# Patient Record
Sex: Male | Born: 1975
Health system: Southern US, Community
[De-identification: ages and names within clinical notes are randomized; demographics above are authoritative.]

## PROBLEM LIST (undated history)

## (undated) DIAGNOSIS — K219 Gastro-esophageal reflux disease without esophagitis: Secondary | ICD-10-CM

## (undated) DIAGNOSIS — M1990 Unspecified osteoarthritis, unspecified site: Secondary | ICD-10-CM

## (undated) DIAGNOSIS — T7840XA Allergy, unspecified, initial encounter: Secondary | ICD-10-CM

## (undated) HISTORY — DX: Unspecified osteoarthritis, unspecified site: M19.90

## (undated) HISTORY — DX: Allergy, unspecified, initial encounter: T78.40XA

## (undated) HISTORY — PX: HIP SURGERY: SHX245

---

## 2004-10-28 ENCOUNTER — Ambulatory Visit: Payer: Self-pay | Admitting: Internal Medicine

## 2004-10-31 ENCOUNTER — Ambulatory Visit: Payer: Self-pay | Admitting: Internal Medicine

## 2004-11-04 ENCOUNTER — Ambulatory Visit: Payer: Self-pay | Admitting: Internal Medicine

## 2004-12-26 ENCOUNTER — Ambulatory Visit: Payer: Self-pay | Admitting: Internal Medicine

## 2007-07-11 ENCOUNTER — Emergency Department (HOSPITAL_COMMUNITY): Admission: EM | Admit: 2007-07-11 | Discharge: 2007-07-11 | Payer: Self-pay | Admitting: Family Medicine

## 2007-07-12 ENCOUNTER — Ambulatory Visit: Payer: Self-pay | Admitting: Internal Medicine

## 2007-07-12 DIAGNOSIS — J019 Acute sinusitis, unspecified: Secondary | ICD-10-CM

## 2007-07-12 DIAGNOSIS — M25559 Pain in unspecified hip: Secondary | ICD-10-CM

## 2008-10-08 ENCOUNTER — Telehealth: Payer: Self-pay | Admitting: Internal Medicine

## 2009-05-20 ENCOUNTER — Emergency Department (HOSPITAL_COMMUNITY): Admission: EM | Admit: 2009-05-20 | Discharge: 2009-05-20 | Payer: Self-pay | Admitting: Family Medicine

## 2009-07-09 IMAGING — CT CT PARANASAL SINUSES LIMITED
3 series · 14 of 47 positions shown, 16 images · non-contrast
Comparison: None available

CLINICAL DATA: HEADACHE, FEVER

CT PARANASAL SINUS LIMITED WITHOUT CONTRAST
TECHNIQUE: Multidetector CT imaging through the paranasal sinuses
was performed in a single plane.

[Series 4: sinus 2.0 h30s st · axial · 0.42mm/px · z∈[-204,-106]mm · 8 of 57 slices shown, 10 images]
[im 4/57  brain]
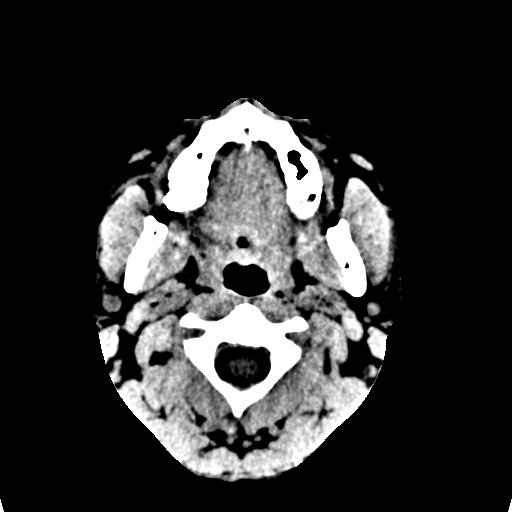
[im 4/57  bone]
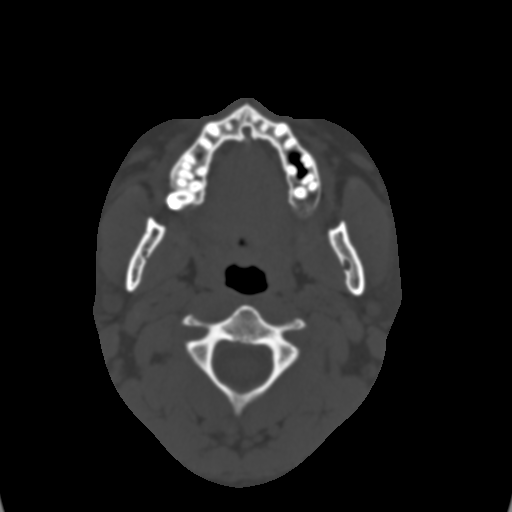
[im 12/57  bone]
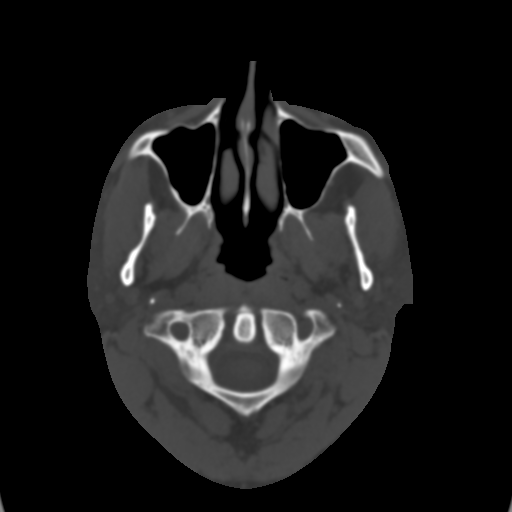
[im 18/57  bone]
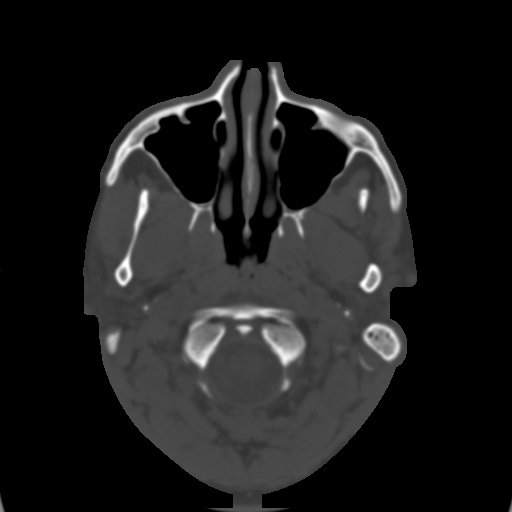
[im 26/57  bone]
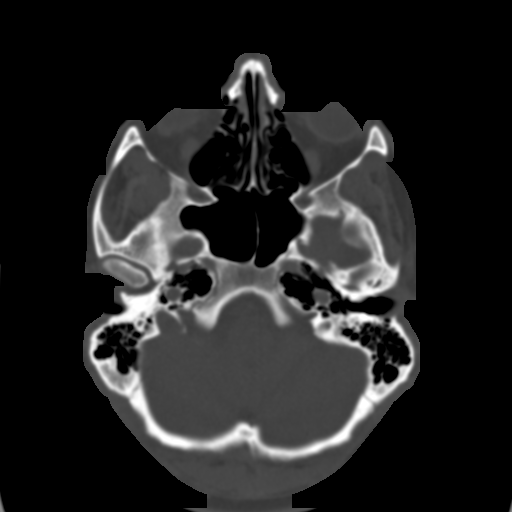
[im 31/57  brain]
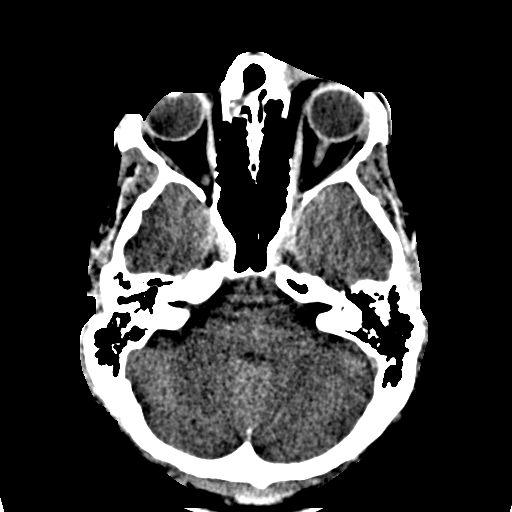
[im 31/57  bone]
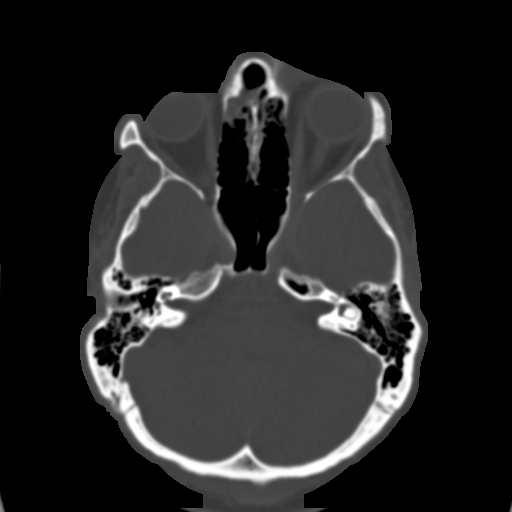
[im 39/57  bone]
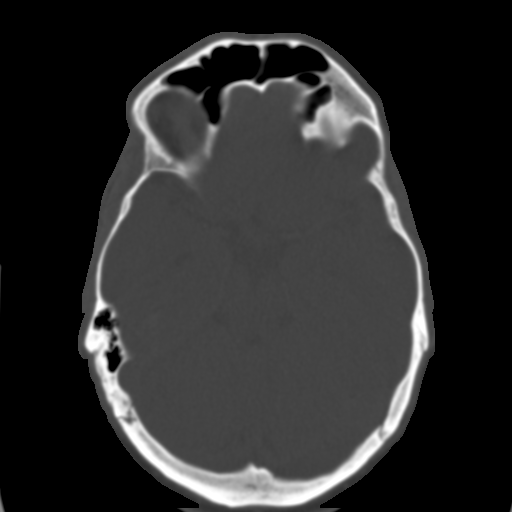
[im 45/57  bone]
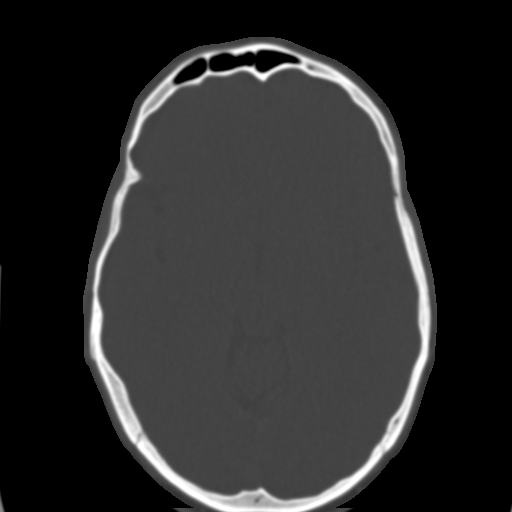
[im 53/57  bone]
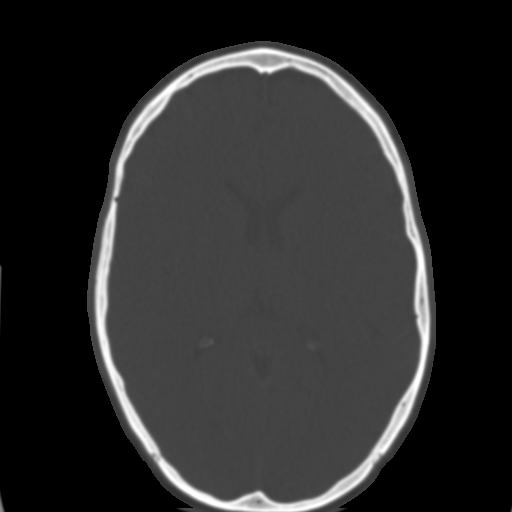

[Series 7: sinus 2.0 spo cor cor cor cor · coronal · 0.26mm/px · 3 of 103 slices shown]
[im 35/103  bone]
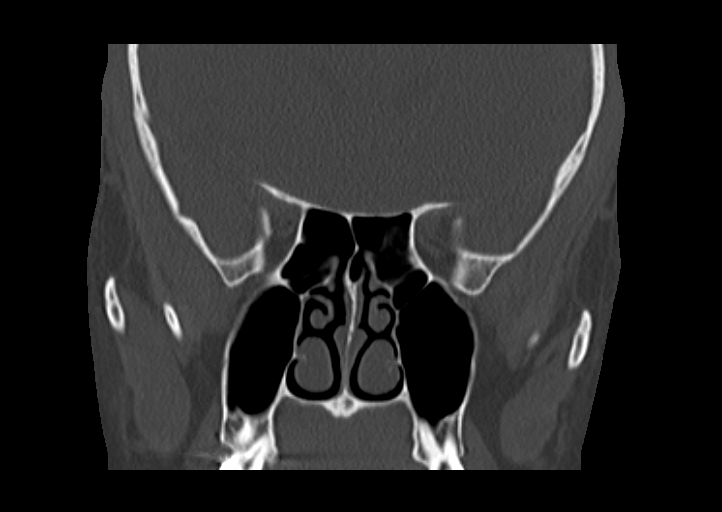
[im 46/103  bone]
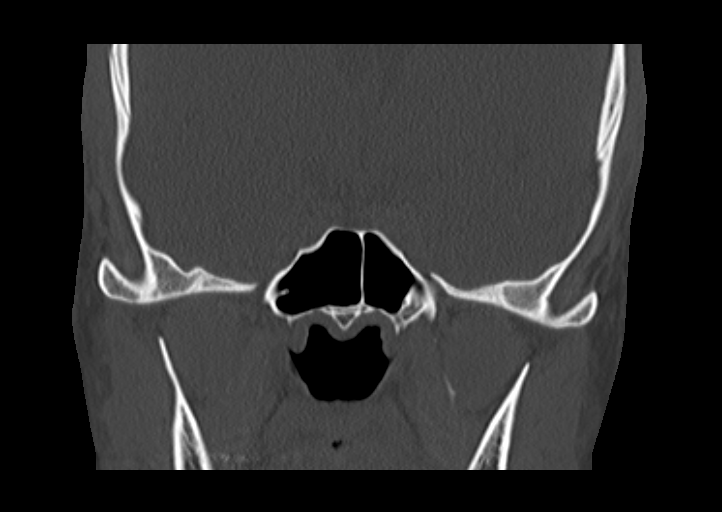
[im 57/103  bone]
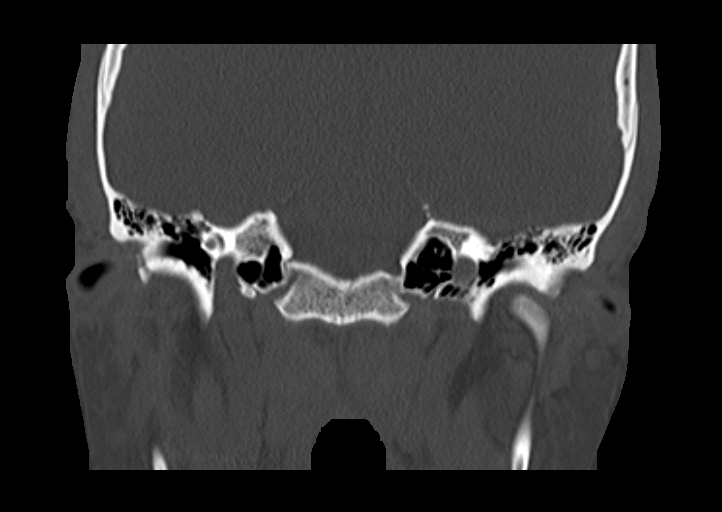

[Series 8: sinus 2.0 spo · sagittal · 0.23mm/px · 3 of 89 slices shown]
[im 30/89  bone]
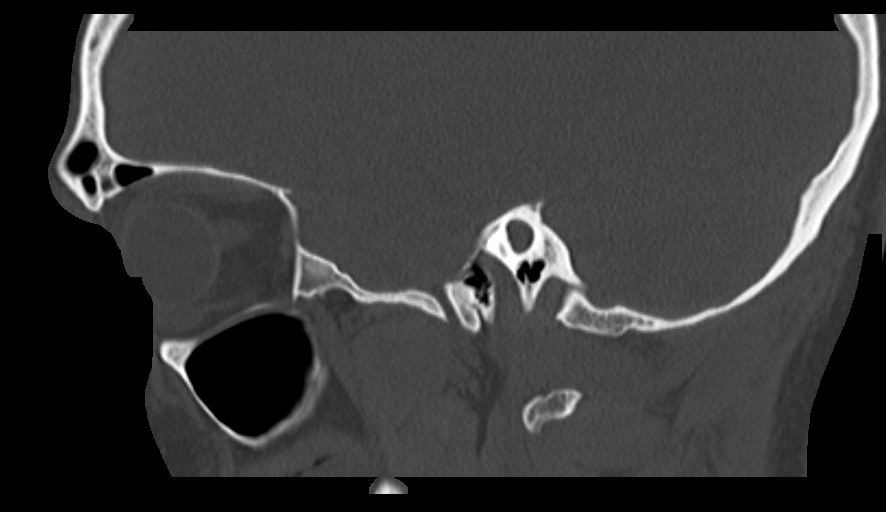
[im 45/89  bone]
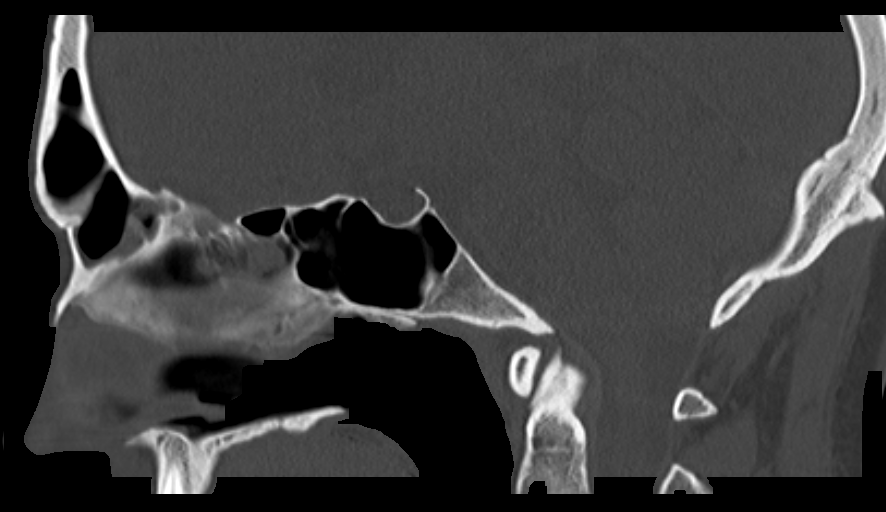
[im 59/89  bone]
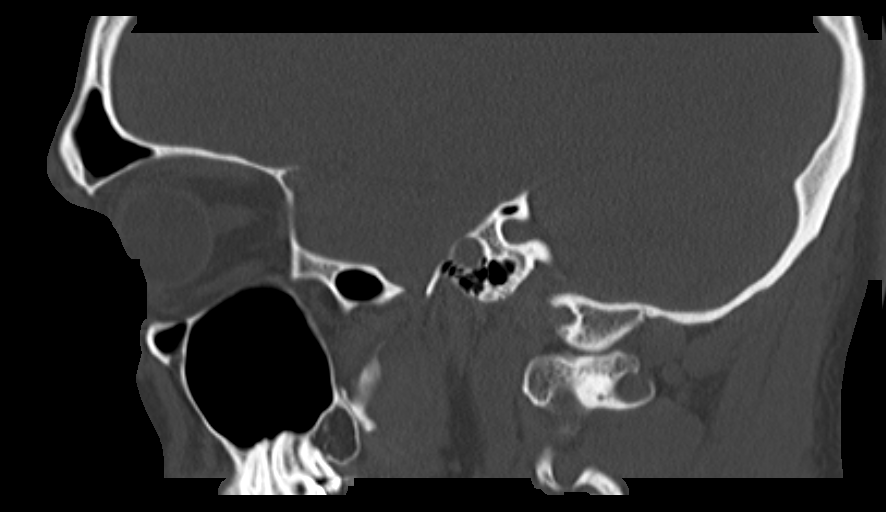

[14 of 47 positions shown; findings below may reference images not displayed]

FINDINGS: Focal opacification of 2 anterior right ethmoid air cells
with a small amount of fluid in the frontal sinuses predominately
on the right.  The remainder of the paranasal sinuses appear
normally developed, well aerated.  Ostiomeatal units are patent
bilaterally.  Nasal septum midline.  Regional bones unremarkable.
Orbits negative.  Temporomandibular joints are seated bilaterally.
IMPRESSION: 1.  Mild right ethmoid and frontal sinus disease as above.

## 2009-10-24 ENCOUNTER — Emergency Department (HOSPITAL_COMMUNITY): Admission: EM | Admit: 2009-10-24 | Discharge: 2009-10-24 | Payer: Self-pay | Admitting: Family Medicine

## 2009-10-26 ENCOUNTER — Emergency Department (HOSPITAL_COMMUNITY): Admission: EM | Admit: 2009-10-26 | Discharge: 2009-10-26 | Payer: Self-pay | Admitting: Emergency Medicine

## 2010-10-23 ENCOUNTER — Inpatient Hospital Stay (INDEPENDENT_AMBULATORY_CARE_PROVIDER_SITE_OTHER)
Admission: RE | Admit: 2010-10-23 | Discharge: 2010-10-23 | Disposition: A | Discharge status: Home / Self Care | Payer: 59 | Source: Ambulatory Visit | Attending: Family Medicine | Admitting: Family Medicine

## 2010-10-23 DIAGNOSIS — H60399 Other infective otitis externa, unspecified ear: Secondary | ICD-10-CM

## 2013-08-09 ENCOUNTER — Ambulatory Visit (INDEPENDENT_AMBULATORY_CARE_PROVIDER_SITE_OTHER): Payer: 59 | Admitting: Family Medicine

## 2013-08-09 VITALS — BP 132/80 | HR 64 | Temp 98.5°F | Resp 16 | Ht 72.0 in | Wt 202.0 lb

## 2013-08-09 DIAGNOSIS — H60399 Other infective otitis externa, unspecified ear: Secondary | ICD-10-CM

## 2013-08-09 DIAGNOSIS — H9202 Otalgia, left ear: Secondary | ICD-10-CM

## 2013-08-09 DIAGNOSIS — J309 Allergic rhinitis, unspecified: Secondary | ICD-10-CM

## 2013-08-09 DIAGNOSIS — H6092 Unspecified otitis externa, left ear: Secondary | ICD-10-CM

## 2013-08-09 DIAGNOSIS — H9209 Otalgia, unspecified ear: Secondary | ICD-10-CM

## 2013-08-09 MED ORDER — CIPROFLOXACIN-DEXAMETHASONE 0.3-0.1 % OT SUSP: 4.0000 [drp] | Freq: Two times a day (BID) | OTIC | Status: DC

## 2013-08-09 MED ORDER — FLUTICASONE PROPIONATE 50 MCG/ACT NA SUSP: 2.0000 | Freq: Every day | NASAL | Status: DC

## 2013-08-09 MED ORDER — HYDROCODONE-ACETAMINOPHEN 5-325 MG PO TABS: 1.0000 | ORAL_TABLET | Freq: Four times a day (QID) | ORAL | Status: DC | PRN

## 2013-08-09 MED ORDER — LEVOFLOXACIN 750 MG PO TABS: 750.0000 mg | ORAL_TABLET | Freq: Every day | ORAL | Status: DC

## 2013-08-09 NOTE — Progress Notes (Signed)
This chart was scribed for Andrew ChickKristi M Miriam Liles, MD by Luisa DagoPriscilla Tutu, ED Scribe. This patient was seen in room 2 and the patient's care was started at 1:47 PM. Subjective:    Patient ID: Andrew Monroe, male    DOB: 03/30/1976, 38 y.o.   MRN: [id number]  08/09/2013  Otalgia  HPI HPI Comments: Andrew BarmanRodney Monroe Back is a 38 y.o. male who presents to the [facility] complaining of worsening left sided otalgia that started about 4 days ago. Pt states that he was suffering from a sinus infection last week and was prescribed Augmentin which he completed yesterday. Pt reports using Ciprodex ear drops from a previous otalgia episode (2 doses of Ciprodex otic that had been expired for 2 years), with no relief. He states that he also washed the ear our with peroxide, upon onset, but with no relief. He also reports taking Ibuprofen 800mg , last dosage 7 hours ago, with temporary relief. Pt is also suffering from hearing loss/muffling in the affected ear. He describes it as feeling like he's in a "drone". Denies any fever, chills, diaphoresis, sore throat. Pt denies any pertinent medical history. Pt is not a drinker.  Reports persistent nasal congestion and sinus pressure with some improvement with Augmentin; compliance with daily oral antihistamine.  Works in [employer].   Review of Systems  Constitutional: Negative for fever, chills and diaphoresis.  HENT: Positive for ear pain, hearing loss, postnasal drip, rhinorrhea and sinus pressure. Negative for sore throat.   Eyes: Negative for visual disturbance.  Respiratory: Negative for cough, shortness of breath and stridor.   Cardiovascular: Negative for chest pain.  Gastrointestinal: Negative for nausea, diarrhea and constipation.  Skin: Negative for rash.    Past Medical History  Diagnosis Date  . Allergy   . Arthritis    No Known Allergies Current Outpatient Prescriptions  Medication Sig Dispense Refill  . cetirizine (ZYRTEC) 10 MG tablet  Take 10 mg by mouth daily.      . ciprofloxacin-dexamethasone (CIPRODEX) otic suspension Place 4 drops into the left ear 2 (two) times daily.  7.5 mL  0  . fluticasone (FLONASE) 50 MCG/ACT nasal spray Place 2 sprays into both nostrils daily.  16 g  6  . HYDROcodone-acetaminophen (NORCO/VICODIN) 5-325 MG per tablet Take 1 tablet by mouth every 6 (six) hours as needed for moderate pain.  30 tablet  0  . levofloxacin (LEVAQUIN) 750 MG tablet Take 1 tablet (750 mg total) by mouth daily.  7 tablet  0   No current facility-administered medications for this visit.   History   Social History  . Marital Status: Married    Spouse Name: N/A    Number of Children: N/A  . Years of Education: N/A   Occupational History  . Not on file.   Social History Main Topics  . Smoking status: Never Smoker   . Smokeless tobacco: Not on file  . Alcohol Use: Not on file  . Drug Use: Not on file  . Sexual Activity: Not on file   Other Topics Concern  . Not on file   Social History Narrative  . No narrative on file       Objective:    BP 132/80  Pulse 64  Temp(Src) 98.5 F (36.9 C)  Resp 16  Ht 6' (1.829 m)  Wt 202 lb (91.627 kg)  BMI 27.39 kg/m2  SpO2 100%  Physical Exam  Nursing note and vitals reviewed. Constitutional: He is oriented  to person, place, and time. He appears well-developed and well-nourished. No distress.  HENT:  Head: Normocephalic and atraumatic.  Right Ear: External ear normal.  Left Ear: No lacerations. There is swelling and tenderness. No drainage. No foreign bodies. No mastoid tenderness. Tympanic membrane is not injected, not perforated, not erythematous and not retracted.  No middle ear effusion. No decreased hearing is noted.  Nose: Nose normal.  Mouth/Throat: Oropharynx is clear and moist. No oropharyngeal exudate.  Swelling and erythema in the external canal of the left ear, associated tenderness with manipulation of external anatomy.   Eyes: Conjunctivae and EOM  are normal. Pupils are equal, round, and reactive to light. Right eye exhibits no discharge. Left eye exhibits no discharge.  Neck: Normal range of motion. Neck supple.  Cardiovascular: Normal rate, regular rhythm and normal heart sounds.  Exam reveals no gallop and no friction rub.   No murmur heard. Pulmonary/Chest: Effort normal and breath sounds normal. No respiratory distress. He has no wheezes. He has no rales. He exhibits no tenderness.  Abdominal: Soft. He exhibits no distension. There is no tenderness.  Musculoskeletal: Normal range of motion. He exhibits no edema and no tenderness.  Neurological: He is alert and oriented to person, place, and time. He has normal reflexes.  Skin: Skin is warm and dry.  Psychiatric: He has a normal mood and affect. His behavior is normal. Thought content normal.   No results found for this or any previous visit.     Assessment & Plan:   1. Ear pain, left   2. Otitis externa, left   3. Allergic rhinitis, cause unspecified    1.  L otitis externa:  New.  Onset on Augmentin thus will treat with Ciprodex, Levaquin. Rx for hydrocodone provided.  Can continue Ibuprofen 800mg  tid. 2.  Allergic rhinitis:  Uncontrolled; continue Zyrtec daily; add Flonase.  Meds ordered this encounter  Medications  . cetirizine (ZYRTEC) 10 MG tablet    Sig: Take 10 mg by mouth daily.  . ciprofloxacin-dexamethasone (CIPRODEX) otic suspension    Sig: Place 4 drops into the left ear 2 (two) times daily.    Dispense:  7.5 mL    Refill:  0  . levofloxacin (LEVAQUIN) 750 MG tablet    Sig: Take 1 tablet (750 mg total) by mouth daily.    Dispense:  7 tablet    Refill:  0  . fluticasone (FLONASE) 50 MCG/ACT nasal spray    Sig: Place 2 sprays into both nostrils daily.    Dispense:  16 g    Refill:  6  . HYDROcodone-acetaminophen (NORCO/VICODIN) 5-325 MG per tablet    Sig: Take 1 tablet by mouth every 6 (six) hours as needed for moderate pain.    Dispense:  30 tablet     Refill:  0    No Follow-up on file.  I personally performed the services described in this documentation, which was scribed in my presence. The recorded information has been reviewed and is accurate.  Nilda SimmerKristi Kierstan Auer, M.Monroe.  [facility]  [place] [address] [place], [place]  [id number] [phone] phone [phone] fax

## 2013-08-09 NOTE — Patient Instructions (Signed)
Otitis Externa Otitis externa is a bacterial or fungal infection of the outer ear canal. This is the area from the eardrum to the outside of the ear. Otitis externa is sometimes called "swimmer's ear." CAUSES  Possible causes of infection include:  Swimming in dirty water.  Moisture remaining in the ear after swimming or bathing.  Mild injury (trauma) to the ear.  Objects stuck in the ear (foreign body).  Cuts or scrapes (abrasions) on the outside of the ear. SYMPTOMS  The first symptom of infection is often itching in the ear canal. Later signs and symptoms may include swelling and redness of the ear canal, ear pain, and yellowish-white fluid (pus) coming from the ear. The ear pain may be worse when pulling on the earlobe. DIAGNOSIS  Your caregiver will perform a physical exam. A sample of fluid may be taken from the ear and examined for bacteria or fungi. TREATMENT  Antibiotic ear drops are often given for 10 to 14 days. Treatment may also include pain medicine or corticosteroids to reduce itching and swelling. PREVENTION   Keep your ear dry. Use the corner of a towel to absorb water out of the ear canal after swimming or bathing.  Avoid scratching or putting objects inside your ear. This can damage the ear canal or remove the protective wax that lines the canal. This makes it easier for bacteria and fungi to grow.  Avoid swimming in lakes, polluted water, or poorly chlorinated pools.  You may use ear drops made of rubbing alcohol and vinegar after swimming. Combine equal parts of white vinegar and alcohol in a bottle. Put 3 or 4 drops into each ear after swimming. HOME CARE INSTRUCTIONS   Apply antibiotic ear drops to the ear canal as prescribed by your caregiver.  Only take over-the-counter or prescription medicines for pain, discomfort, or fever as directed by your caregiver.  If you have diabetes, follow any additional treatment instructions from your caregiver.  Keep all  follow-up appointments as directed by your caregiver. SEEK MEDICAL CARE IF:   You have a fever.  Your ear is still red, swollen, painful, or draining pus after 3 days.  Your redness, swelling, or pain gets worse.  You have a severe headache.  You have redness, swelling, pain, or tenderness in the area behind your ear. MAKE SURE YOU:   Understand these instructions.  Will watch your condition.  Will get help right away if you are not doing well or get worse. Document Released: 04/03/2005 Document Revised: 06/26/2011 Document Reviewed: 04/20/2011 Exit[employer] Patient Information 2014 [employer].  

## 2015-06-29 ENCOUNTER — Ambulatory Visit (INDEPENDENT_AMBULATORY_CARE_PROVIDER_SITE_OTHER): Payer: 59 | Admitting: Physician Assistant

## 2015-06-29 VITALS — BP 127/83 | HR 46 | Temp 98.1°F | Resp 16

## 2015-06-29 DIAGNOSIS — Z125 Encounter for screening for malignant neoplasm of prostate: Secondary | ICD-10-CM

## 2015-06-29 DIAGNOSIS — Z130 Encounter for screening for diseases of the blood and blood-forming organs and certain disorders involving the immune mechanism: Secondary | ICD-10-CM | POA: Diagnosis not present

## 2015-06-29 DIAGNOSIS — Z13228 Encounter for screening for other metabolic disorders: Secondary | ICD-10-CM | POA: Diagnosis not present

## 2015-06-29 DIAGNOSIS — Z13220 Encounter for screening for lipoid disorders: Secondary | ICD-10-CM

## 2015-06-29 DIAGNOSIS — Z23 Encounter for immunization: Secondary | ICD-10-CM

## 2015-06-29 DIAGNOSIS — Z1329 Encounter for screening for other suspected endocrine disorder: Secondary | ICD-10-CM | POA: Diagnosis not present

## 2015-06-29 DIAGNOSIS — L989 Disorder of the skin and subcutaneous tissue, unspecified: Secondary | ICD-10-CM | POA: Diagnosis not present

## 2015-06-29 DIAGNOSIS — Z0000 Encounter for general adult medical examination without abnormal findings: Secondary | ICD-10-CM | POA: Diagnosis not present

## 2015-06-29 LAB — COMPLETE METABOLIC PANEL WITH GFR
ALBUMIN: 4.7 g/dL (ref 3.6–5.1)
ALT: 21 U/L (ref 9–46)
AST: 17 U/L (ref 10–40)
Alkaline Phosphatase: 53 U/L (ref 40–115)
BUN: 12 mg/dL (ref 7–25)
CHLORIDE: 101 mmol/L (ref 98–110)
CO2: 29 mmol/L (ref 20–31)
Calcium: 9.7 mg/dL (ref 8.6–10.3)
Creat: 1.05 mg/dL (ref 0.60–1.35)
GFR, Est African American: >89 mL/min (ref >=60)
GFR, Est Non African American: 88 mL/min (ref >=60)
GLUCOSE: 91 mg/dL (ref 65–99)
POTASSIUM: 4.7 mmol/L (ref 3.5–5.3)
SODIUM: 139 mmol/L (ref 135–146)
Total Bilirubin: 1.1 mg/dL (ref 0.2–1.2)
Total Protein: 7.3 g/dL (ref 6.1–8.1)

## 2015-06-29 LAB — LIPID PANEL
Cholesterol: 199 mg/dL (ref 125–200)
HDL: 37 mg/dL — AB (ref >=40)
LDL CALC: 136 mg/dL — AB (ref <130)
Total CHOL/HDL Ratio: 5.4 Ratio — ABNORMAL HIGH (ref <=5.0)
Triglycerides: 132 mg/dL (ref <150)
VLDL: 26 mg/dL (ref <30)

## 2015-06-29 LAB — CBC
HCT: 44.2 % (ref 39.0–52.0)
HEMOGLOBIN: 15.8 g/dL (ref 13.0–17.0)
MCH: 32.4 pg (ref 26.0–34.0)
MCHC: 35.7 g/dL (ref 30.0–36.0)
MCV: 90.6 fL (ref 78.0–100.0)
MPV: 10.8 fL (ref 8.6–12.4)
Platelets: 176 10*3/uL (ref 150–400)
RBC: 4.88 MIL/uL (ref 4.22–5.81)
RDW: 12.9 % (ref 11.5–15.5)
WBC: 4.6 10*3/uL (ref 4.0–10.5)

## 2015-06-29 NOTE — Progress Notes (Signed)
[facility] [address], [place] [id number] [phone]  Date:  06/29/2015   Name:  Andrew GlennRodney C Monroe   DOB:  03/10/1976   MRN:  [id number]  PCP:  Andrew Lemonsobert Yoo, DO    History of Present Illness:  Andrew Monroe is a 40 y.o. male patient who [facility] for annual physical exam and skin lesion near eye.  Has one concern of his skin changing around his eye.  He states that not through any known trigger, he has bumps on the skin of the left lateral side of his eye.  These are non-pruritic or tender.  At times, they change size and become red and larger.  He has no known allergy exposure at the time.  No hx of skin changes.  They will then calm and not reappear.  He can not associate them with stressors or sickness.  Diet: eats fruits and salads.  Eats a lot of fish and chicken.  He eats pork.  hye eats fried foods.  Water intake is about 12-20oz per day.  Drinks a lot of propel and coke0.    BM: daily.  No blood in the stool.  Constipation occasional, no diarrhea  Urination: no weakened stream, dysuria, hematuria, or frequency.  Sleep: sleep in cath lab.  5hrs/8hrs.    Social activity: golf.  2 kids basketaball, fishing.  2 kids 12, 8.   Married: sexually activie no difficulty.   EtOH: none Illicit drug: none Tobacco or vaping: none.  Patient Active Problem List   Diagnosis Date Noted  . SINUSITIS- ACUTE-NOS 07/12/2007  . HIP PAIN, RIGHT 07/12/2007    Past Medical History  Diagnosis Date  . Allergy   . Arthritis     Past Surgical History  Procedure Laterality Date  . Hip surgery      Social History  Substance Use Topics  . Smoking status: Never Smoker   . Smokeless tobacco: Not on file  . Alcohol Use: Not on file    Family History  Problem Relation Age of Onset  . Hyperlipidemia Father     No Known Allergies  Medication list has been reviewed and updated.  Current Outpatient Prescriptions on File Prior to Visit  Medication Sig  Dispense Refill  . cetirizine (ZYRTEC) 10 MG tablet Take 10 mg by mouth daily.     No current facility-administered medications on file prior to visit.    Review of Systems  Constitutional: Negative for fever and chills.  HENT: Negative for ear discharge, ear pain and sore throat.   Eyes: Negative for blurred vision and double vision.  Respiratory: Negative for cough, shortness of breath and wheezing.   Cardiovascular: Negative for chest pain, palpitations and leg swelling.  Gastrointestinal: Positive for constipation (infrequent). Negative for nausea, vomiting and diarrhea.  Genitourinary: Negative for dysuria, frequency and hematuria.  Skin: Negative for itching and rash.  Neurological: Negative for dizziness and headaches.     Physical Examination: BP 127/83 mmHg  Pulse 46  Temp(Src) 98.1 F (36.7 C)  Resp 16 Ideal Body Weight:    Physical Exam  Constitutional: He is oriented to person, place, and time. He appears well-developed and well-nourished. No distress.  HENT:  Head: Normocephalic and atraumatic.  Right Ear: Tympanic membrane, external ear and ear canal normal.  Left Ear: Tympanic membrane, external ear and ear canal normal.  Eyes: Conjunctivae and EOM are normal. Pupils are equal, round, and reactive to light.  Cardiovascular: Normal rate  and regular rhythm.  Exam reveals no friction rub.   No murmur heard. Pulmonary/Chest: Effort normal. No respiratory distress. He has no wheezes.  Abdominal: Soft. Bowel sounds are normal. He exhibits no distension and no mass. There is no tenderness.  Musculoskeletal: Normal range of motion. He exhibits no edema or tenderness.  Neurological: He is alert and oriented to person, place, and time. He displays normal reflexes.  Skin: Skin is warm and dry. He is not diaphoretic.  Tiny hypopigmented papules that are nontender and blanching at the lateral side near eye lid  Psychiatric: He has a normal mood and affect. His behavior is  normal.     Assessment and Plan: Andrew Monroe is a 40 y.o. male who is here today for annual physical exam, and referral to dermatology. This appears to possible allergy, autoimmune manifestation.  Will refer to dermatology at this time.   Annual physical exam - Plan: CBC, COMPLETE METABOLIC PANEL WITH GFR, TSH, Lipid panel, PSA, Ambulatory referral to Dermatology  Screening for deficiency anemia - Plan: CBC  Screening for metabolic disorder - Plan: COMPLETE METABOLIC PANEL WITH GFR  Screening for thyroid disorder - Plan: TSH  Screening for prostate cancer - Plan: PSA  Screening for lipid disorders - Plan: Lipid panel  Lesion of skin of face - Plan: Ambulatory referral to Dermatology  Need for TD vaccine - Plan: Td vaccine greater than or equal to 7yo preservative free IM  Trena Platt, PA-C [facility] 06/29/2015 11:57 AM

## 2015-06-29 NOTE — Patient Instructions (Addendum)
IF you received an x-ray today, you will receive an invoice from [facility]. Please contact [facility] at [phone] with questions or concerns regarding your invoice.   IF you received labwork today, you will receive an invoice from [employer]. Please contact [facility] at [phone] with questions or concerns regarding your invoice.   Our billing staff will not be able to assist you with questions regarding bills from these companies.  You will be contacted with the lab results as soon as they are available. The fastest way to get your results is to activate your My Chart account. Instructions are located on the last page of this paperwork. If you have not heard from [place] regarding the results in 2 weeks, please contact this office.  Please hydrate with 64 oz of water or more. I would like you to await contact for dermatology appointment.  If you have no phone call within the next 10 days, please contact us. Please start the flonase and zyrtec at this time.  You can take the zyrtec throughout the season, and flonase for nasal congestion flare ups.   Keeping you healthy  Get these tests  Blood pressure- Have your blood pressure checked once a year by your healthcare provider.  Normal blood pressure is 120/80.  Weight- Have your body mass index (BMI) calculated to screen for obesity.  BMI is a measure of body fat based on height and weight. You can also calculate your own BMI at https://www.west-esparza.com/.  Cholesterol- Have your cholesterol checked regularly starting at age 37, sooner may be necessary if you have diabetes, high blood pressure, if a family member developed heart diseases at an early age or if you smoke.   Chlamydia, HIV, and other sexual transmitted disease- Get screened each year until the age of 24 then within three months of each new sexual partner.  Diabetes- Have your blood sugar checked regularly if you have high blood  pressure, high cholesterol, a family history of diabetes or if you are overweight.  Get these vaccines  Flu shot- Every fall.  Tetanus shot- Every 10 years.  Menactra- Single dose; prevents meningitis.  Take these steps  Don't smoke- If you do smoke, ask your healthcare provider about quitting. For tips on how to quit, go to www.smokefree.gov or call 1-800-QUIT-NOW.  Be physically active- Exercise 5 days a week for at least 30 minutes.  If you are not already physically active start slow and gradually work up to 30 minutes of moderate physical activity.  Examples of moderate activity include walking briskly, mowing the yard, dancing, swimming bicycling, etc.  Eat a healthy diet- Eat a variety of healthy foods such as fruits, vegetables, low fat milk, low fat cheese, yogurt, lean meats, poultry, fish, beans, tofu, etc.  For more information on healthy eating, go to www.thenutritionsource.org  Drink alcohol in moderation- Limit alcohol intake two drinks or less a day.  Never drink and drive.  Dentist- Brush and floss teeth twice daily; visit your dentis twice a year.  Depression-Your emotional health is as important as your physical health.  If you're feeling down, losing interest in things you normally enjoy please talk with your healthcare provider.  Gun Safety- If you keep a gun in your home, keep it unloaded and with the safety lock on.  Bullets should be stored separately.  Helmet use- Always wear a helmet when riding a motorcycle, bicycle, rollerblading or skateboarding.  Safe sex- If you may be exposed to a sexually  transmitted infection, use a condom  Seat belts- Seat bels can save your life; always wear one.  Smoke/Carbon Monoxide detectors- These detectors need to be installed on the appropriate level of your home.  Replace batteries at least once a year.  Skin Cancer- When out in the sun, cover up and use sunscreen SPF 15 or higher.  Violence- If anyone is threatening or  hurting you, please tell your healthcare provider.

## 2015-06-30 LAB — TSH: TSH: 2 mIU/L (ref 0.40–4.50)

## 2015-06-30 LAB — PSA: PSA: 0.6 ng/mL (ref <=4.00)

## 2015-07-11 ENCOUNTER — Encounter: Payer: Self-pay | Admitting: Physician Assistant

## 2015-12-30 DIAGNOSIS — N62 Hypertrophy of breast: Secondary | ICD-10-CM | POA: Diagnosis not present

## 2017-07-23 ENCOUNTER — Ambulatory Visit (INDEPENDENT_AMBULATORY_CARE_PROVIDER_SITE_OTHER): Payer: 59 | Admitting: Adult Health

## 2017-07-23 ENCOUNTER — Encounter: Payer: Self-pay | Admitting: Adult Health

## 2017-07-23 VITALS — BP 114/75 | HR 77 | Ht 72.75 in | Wt 196.9 lb

## 2017-07-23 DIAGNOSIS — Z114 Encounter for screening for human immunodeficiency virus [HIV]: Secondary | ICD-10-CM | POA: Diagnosis not present

## 2017-07-23 DIAGNOSIS — Z0000 Encounter for general adult medical examination without abnormal findings: Secondary | ICD-10-CM | POA: Diagnosis not present

## 2017-07-23 NOTE — Assessment & Plan Note (Signed)
Increase water intake, strive for at least 100 ounces/day.   Follow Heart Healthy diet Continue regular exercise.  Recommend at least 30 minutes daily, 5 days per week of walking, jogging, biking, swimming, YouTube/Pinterest workout videos. Please schedule complete physical with fasting labs this Spring.

## 2017-07-23 NOTE — Progress Notes (Addendum)
Subjective:    Patient ID: Andrew Monroe, male    DOB: [birth year], 43 y.o.   MRN: [id number]  HPI:  Andrew Monroe is here to establish as a new pt.  He is a pleasant 42 year old male.  PMH:  Seasonal allergies- treated with OTC Nasal spray and Allegra. 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 He has been trying to eat more fruits/vegetable and reduce saturated fat. He denies tobacco/ETOH use His father had CABG x 4 two years ago a age 63.   His paternal grandmother had multiple stents, however lived until age 84 Overall he feels that his health is "good"  Patient Care Team    Relationship Specialty Notifications Start End  Danford, Jinny Blossom, NP PCP - General Family Medicine  07/23/17     Patient Active Problem List   Diagnosis Date Noted  . Healthcare maintenance 07/23/2017  . SINUSITIS- ACUTE-NOS 07/12/2007  . HIP PAIN, RIGHT 07/12/2007     Past Medical History:  Diagnosis Date  . Allergy   . Arthritis      Past Surgical History:  Procedure Laterality Date  . HIP SURGERY       Family History  Problem Relation Age of Onset  . Arthritis Mother   . Hyperlipidemia Father   . Heart disease Father   . Healthy Sister   . Healthy Brother   . Healthy Son   . Healthy Son      Social History   Substance and Sexual Activity  Drug Use Never     Social History   Substance and Sexual Activity  Alcohol Use Never  . Frequency: Never     Social History   Tobacco Use  Smoking Status Never Smoker  Smokeless Tobacco Never Used     Outpatient Encounter Medications as of 07/23/2017  Medication Sig  . fexofenadine-pseudoephedrine (ALLEGRA-D 24) 180-240 MG 24 hr tablet Take 1 tablet by mouth daily.  . fluticasone (FLONASE) 50 MCG/ACT nasal spray Place 1 spray into both nostrils  daily.  Marland Kitchen ibuprofen (ADVIL,MOTRIN) 200 MG tablet Take 600 mg by mouth every 6 (six) hours as needed.  . niacin (SLO-NIACIN) 500 MG tablet Take 500 mg by mouth at bedtime.  . [DISCONTINUED] cetirizine (ZYRTEC) 10 MG tablet Take 10 mg by mouth daily.   No facility-administered encounter medications on file as of 07/23/2017.     Allergies: Patient has no known allergies.  Body mass index is 26.16 kg/m.  Blood pressure 114/75, pulse 77, height 6' 0.75" (1.848 m), weight 196 lb 14.4 oz (89.3 kg), SpO2 98 %.      Review of Systems  Constitutional: Positive for fatigue. Negative for activity change, appetite change, chills, diaphoresis, fever and unexpected weight change.  Eyes: Negative for visual disturbance.  Respiratory: Negative for cough, chest tightness, shortness of breath, wheezing and stridor.   Cardiovascular: Negative for chest pain, palpitations and leg swelling.  Gastrointestinal: Negative for abdominal distention, abdominal pain, blood in stool, constipation, diarrhea, nausea and vomiting.  Endocrine: Negative for cold intolerance, heat intolerance, polydipsia, polyphagia and polyuria.  Genitourinary: Negative for difficulty urinating, flank pain and hematuria.  Musculoskeletal: Positive for arthralgias and myalgias. Negative for back pain, gait problem, joint swelling, neck pain and neck stiffness.       Chronic, intermittent R hip  pain r/t arthritis   Skin: Negative for color change, pallor, rash and wound.  Neurological: Negative for dizziness and headaches.  Hematological: Does not bruise/bleed easily.  Psychiatric/Behavioral: Negative for confusion, decreased concentration, dysphoric mood, hallucinations, self-injury, sleep disturbance and suicidal ideas. The patient is not nervous/anxious and is not hyperactive.        Objective:   Physical Exam  Constitutional: He is oriented to person, place, and time. He appears well-developed and well-nourished. No distress.   HENT:  Head: Normocephalic and atraumatic.  Right Ear: External ear normal.  Left Ear: External ear normal.  Cardiovascular: Normal rate, regular rhythm, normal heart sounds and intact distal pulses.  No murmur heard. Pulmonary/Chest: Effort normal and breath sounds normal. No respiratory distress. He has no wheezes. He has no rales. He exhibits no tenderness.  Neurological: He is alert and oriented to person, place, and time.  Skin: Skin is warm and dry. No rash noted. He is not diaphoretic. No erythema. No pallor.  Psychiatric: He has a normal mood and affect. His behavior is normal. Judgment and thought content normal.  Nursing note and vitals reviewed.     Assessment & Plan:   1. Healthcare maintenance   2. Screening for HIV (human immunodeficiency virus)     Healthcare maintenance Increase water intake, strive for at least 100 ounces/day.   Follow Heart Healthy diet Continue regular exercise.  Recommend at least 30 minutes daily, 5 days per week of walking, jogging, biking, swimming, YouTube/Pinterest workout videos. Please schedule complete physical with fasting labs this Spring.    FOLLOW-UP:  Return in about 3 months (around 10/22/2017) for CPE, Fasting Labs.

## 2017-07-23 NOTE — Patient Instructions (Signed)
Mediterranean Diet A Mediterranean diet refers to food and lifestyle choices that are based on the traditions of countries located on the [place] Sea. This way of eating has been shown to help prevent certain conditions and improve outcomes for people who have chronic diseases, like kidney disease and heart disease. What are tips for following this plan? Lifestyle  Cook and eat meals together with your family, when possible.  Drink enough fluid to keep your urine clear or pale yellow.  Be physically active every day. This includes: ? Aerobic exercise like running or swimming. ? Leisure activities like gardening, walking, or housework.  Get 7-8 hours of sleep each night.  If recommended by your health care provider, drink red wine in moderation. This means 1 glass a day for nonpregnant women and 2 glasses a day for men. A glass of wine equals 5 oz (150 mL). Reading food labels  Check the serving size of packaged foods. For foods such as rice and pasta, the serving size refers to the amount of cooked product, not dry.  Check the total fat in packaged foods. Avoid foods that have saturated fat or trans fats.  Check the ingredients list for added sugars, such as corn syrup. Shopping  At the grocery store, buy most of your food from the areas near the walls of the store. This includes: ? Fresh fruits and vegetables (produce). ? Grains, beans, nuts, and seeds. Some of these may be available in unpackaged forms or large amounts (in bulk). ? Fresh seafood. ? Poultry and eggs. ? Low-fat dairy products.  Buy whole ingredients instead of prepackaged foods.  Buy fresh fruits and vegetables in-season from local farmers markets.  Buy frozen fruits and vegetables in resealable bags.  If you do not have access to quality fresh seafood, buy precooked frozen shrimp or canned fish, such as tuna, salmon, or sardines.  Buy small amounts of raw or cooked vegetables, salads, or olives from the  deli or salad bar at your store.  Stock your pantry so you always have certain foods on hand, such as olive oil, canned tuna, canned tomatoes, rice, pasta, and beans. Cooking  Cook foods with extra-virgin olive oil instead of using butter or other vegetable oils.  Have meat as a side dish, and have vegetables or grains as your main dish. This means having meat in small portions or adding small amounts of meat to foods like pasta or stew.  Use beans or vegetables instead of meat in common dishes like chili or lasagna.  Experiment with different cooking methods. Try roasting or broiling vegetables instead of steaming or sauteing them.  Add frozen vegetables to soups, stews, pasta, or rice.  Add nuts or seeds for added healthy fat at each meal. You can add these to yogurt, salads, or vegetable dishes.  Marinate fish or vegetables using olive oil, lemon juice, garlic, and fresh herbs. Meal planning  Plan to eat 1 vegetarian meal one day each week. Try to work up to 2 vegetarian meals, if possible.  Eat seafood 2 or more times a week.  Have healthy snacks readily available, such as: ? Vegetable sticks with hummus. ? Greek yogurt. ? Fruit and nut trail mix.  Eat balanced meals throughout the week. This includes: ? Fruit: 2-3 servings a day ? Vegetables: 4-5 servings a day ? Low-fat dairy: 2 servings a day ? Fish, poultry, or lean meat: 1 serving a day ? Beans and legumes: 2 or more servings a week ? Nuts   and seeds: 1-2 servings a day ? Whole grains: 6-8 servings a day ? Extra-virgin olive oil: 3-4 servings a day  Limit red meat and sweets to only a few servings a month What are my food choices?  Mediterranean diet ? Recommended ? Grains: Whole-grain pasta. Brown rice. Bulgar wheat. Polenta. Couscous. Whole-wheat bread. [place]pah Cobbatmeal. Quinoa. ? Vegetables: Artichokes. Beets. Broccoli. Cabbage. Carrots. Eggplant. Green beans. Chard. Kale. Spinach. Onions. Leeks. Peas. Squash.  Tomatoes. Peppers. Radishes. ? Fruits: Apples. Apricots. Avocado. Berries. Bananas. Cherries. Dates. Figs. Grapes. Lemons. Melon. Oranges. Peaches. Plums. Pomegranate. ? Meats and other protein foods: Beans. Almonds. Sunflower seeds. Pine nuts. Peanuts. Cod. Salmon. Scallops. Shrimp. Tuna. Tilapia. Clams. Oysters. Eggs. ? Dairy: Low-fat milk. Cheese. Greek yogurt. ? Beverages: Water. Red wine. Herbal tea. ? Fats and oils: Extra virgin olive oil. Avocado oil. Grape seed oil. ? Sweets and desserts: [place] yogurt with honey. Baked apples. Poached pears. Trail mix. ? Seasoning and other foods: Basil. Cilantro. Coriander. Cumin. Mint. Parsley. Sage. Rosemary. Tarragon. Garlic. Oregano. Thyme. Pepper. Balsalmic vinegar. Tahini. Hummus. Tomato sauce. Olives. Mushrooms. ? Limit these ? Grains: Prepackaged pasta or rice dishes. Prepackaged cereal with added sugar. ? Vegetables: Deep fried potatoes (french fries). ? Fruits: Fruit canned in syrup. ? Meats and other protein foods: Beef. Pork. Lamb. Poultry with skin. Hot dogs. Tomasa BlaseBacon. ? Dairy: Ice cream. Sour cream. Whole milk. ? Beverages: Juice. Sugar-sweetened soft drinks. Beer. Liquor and spirits. ? Fats and oils: Butter. Canola oil. Vegetable oil. Beef fat (tallow). Lard. ? Sweets and desserts: Cookies. Cakes. Pies. Candy. ? Seasoning and other foods: Mayonnaise. Premade sauces and marinades. ? The items listed may not be a complete list. Talk with your dietitian about what dietary choices are right for you. Summary  The Mediterranean diet includes both food and lifestyle choices.  Eat a variety of fresh fruits and vegetables, beans, nuts, seeds, and whole grains.  Limit the amount of red meat and sweets that you eat.  Talk with your health care provider about whether it is safe for you to drink red wine in moderation. This means 1 glass a day for nonpregnant women and 2 glasses a day for men. A glass of wine equals 5 oz (150 mL). This information  is not intended to replace advice given to you by your health care provider. Make sure you discuss any questions you have with your health care provider. Document Released: 11/25/2015 Document Revised: 12/28/2015 Document Reviewed: 11/25/2015 Elsevier Interactive Patient Education  2018 [employer]  Increase water intake, strive for at least 100 ounces/day.   Follow Heart Healthy diet Continue regular exercise.  Recommend at least 30 minutes daily, 5 days per week of walking, jogging, biking, swimming, YouTube/Pinterest workout videos. Please schedule complete physical with fasting labs this Spring. WELCOME TO THE PRACTICE!

## 2017-08-17 ENCOUNTER — Encounter: Payer: 59 | Admitting: Adult Health

## 2017-08-17 ENCOUNTER — Other Ambulatory Visit: Payer: 59

## 2017-08-17 DIAGNOSIS — Z0000 Encounter for general adult medical examination without abnormal findings: Secondary | ICD-10-CM

## 2017-08-17 DIAGNOSIS — Z114 Encounter for screening for human immunodeficiency virus [HIV]: Secondary | ICD-10-CM

## 2017-08-18 LAB — COMPREHENSIVE METABOLIC PANEL
A/G RATIO: 2 (ref 1.2–2.2)
ALBUMIN: 4.4 g/dL (ref 3.5–5.5)
ALK PHOS: 57 IU/L (ref 39–117)
ALT: 21 IU/L (ref 0–44)
AST: 18 IU/L (ref 0–40)
BUN / CREAT RATIO: 14 (ref 9–20)
BUN: 16 mg/dL (ref 6–24)
Bilirubin Total: 0.9 mg/dL (ref 0.0–1.2)
CO2: 22 mmol/L (ref 20–29)
Calcium: 9.2 mg/dL (ref 8.7–10.2)
Chloride: 107 mmol/L — ABNORMAL HIGH (ref 96–106)
Creatinine, Ser: 1.12 mg/dL (ref 0.76–1.27)
GFR calc Af Amer: 93 mL/min/{1.73_m2} (ref >59)
GFR calc non Af Amer: 81 mL/min/{1.73_m2} (ref >59)
GLUCOSE: 87 mg/dL (ref 65–99)
Globulin, Total: 2.2 g/dL (ref 1.5–4.5)
Potassium: 4.1 mmol/L (ref 3.5–5.2)
SODIUM: 142 mmol/L (ref 134–144)
Total Protein: 6.6 g/dL (ref 6.0–8.5)

## 2017-08-18 LAB — LIPID PANEL
CHOL/HDL RATIO: 4.7 ratio (ref 0.0–5.0)
Cholesterol, Total: 184 mg/dL (ref 100–199)
HDL: 39 mg/dL — ABNORMAL LOW (ref >39)
LDL CALC: 122 mg/dL — AB (ref 0–99)
TRIGLYCERIDES: 117 mg/dL (ref 0–149)
VLDL CHOLESTEROL CAL: 23 mg/dL (ref 5–40)

## 2017-08-18 LAB — CBC WITH DIFFERENTIAL/PLATELET
BASOS ABS: 0 10*3/uL (ref 0.0–0.2)
Basos: 0 %
EOS (ABSOLUTE): 0.1 10*3/uL (ref 0.0–0.4)
Eos: 3 %
Hematocrit: 42.1 % (ref 37.5–51.0)
Hemoglobin: 15.2 g/dL (ref 13.0–17.7)
IMMATURE GRANS (ABS): 0 10*3/uL (ref 0.0–0.1)
Immature Granulocytes: 0 %
LYMPHS ABS: 1.6 10*3/uL (ref 0.7–3.1)
Lymphs: 33 %
MCH: 31.2 pg (ref 26.6–33.0)
MCHC: 36.1 g/dL — AB (ref 31.5–35.7)
MCV: 86 fL (ref 79–97)
MONOS ABS: 0.4 10*3/uL (ref 0.1–0.9)
Monocytes: 9 %
NEUTROS ABS: 2.7 10*3/uL (ref 1.4–7.0)
Neutrophils: 55 %
Platelets: 181 10*3/uL (ref 150–379)
RBC: 4.87 x10E6/uL (ref 4.14–5.80)
RDW: 13.4 % (ref 12.3–15.4)
WBC: 4.8 10*3/uL (ref 3.4–10.8)

## 2017-08-18 LAB — HEMOGLOBIN A1C
Est. average glucose Bld gHb Est-mCnc: 94 mg/dL
Hgb A1c MFr Bld: 4.9 % (ref 4.8–5.6)

## 2017-08-18 LAB — TSH: TSH: 1.87 u[IU]/mL (ref 0.450–4.500)

## 2017-08-18 LAB — HIV ANTIBODY (ROUTINE TESTING W REFLEX): HIV Screen 4th Generation wRfx: NONREACTIVE

## 2017-09-05 NOTE — Progress Notes (Signed)
Subjective:    Patient ID: Andrew Monroe, male    DOB: 25-Sep-1975, 42 y.o.   MRN: [id number]  HPI:  Mr. Cardinal presents for CPE He denies acute complaints/changes since establishing with practice last month. Reviewed all recent labs, of note: HDL-39, LDL-122 He plays basketball twice week for 60-90 mins per session. He drinks only 15-20 oz plain water/day, 3-4 Coke Zero/day He denies tobacco/ETOH He reports restful sleep, only about 5 hrs night during week days, >8 hrs night on weekend  Healthcare Maintenance: Colonoscopy- not indicated Immunizations-UTD   Patient Care Team    Relationship Specialty Notifications Start End  Julaine Fusi, NP PCP - General Family Medicine  07/23/17     Patient Active Problem List   Diagnosis Date Noted  . Elevated LDL cholesterol level 09/06/2017  . Healthcare maintenance 07/23/2017  . SINUSITIS- ACUTE-NOS 07/12/2007  . HIP PAIN, RIGHT 07/12/2007     Past Medical History:  Diagnosis Date  . Allergy   . Arthritis      Past Surgical History:  Procedure Laterality Date  . HIP SURGERY       Family History  Problem Relation Age of Onset  . Arthritis Mother   . Hyperlipidemia Father   . Heart disease Father   . Healthy Sister   . Healthy Brother   . Healthy Son   . Healthy Son      Social History   Substance and Sexual Activity  Drug Use Never     Social History   Substance and Sexual Activity  Alcohol Use Never  . Frequency: Never     Social History   Tobacco Use  Smoking Status Never Smoker  Smokeless Tobacco Never Used     Outpatient Encounter Medications as of 09/06/2017  Medication Sig  . fexofenadine-pseudoephedrine (ALLEGRA-D 24) 180-240 MG 24 hr tablet Take 1 tablet by mouth daily.  . fluticasone (FLONASE) 50 MCG/ACT nasal spray Place 1 spray into both nostrils daily.  . niacin (SLO-NIACIN) 500 MG tablet Take 500 mg by mouth at bedtime.  . [DISCONTINUED] ibuprofen (ADVIL,MOTRIN) 200 MG tablet Take 600  mg by mouth every 6 (six) hours as needed.  Marland Kitchen ibuprofen (ADVIL,MOTRIN) 600 MG tablet Take 1 tablet (600 mg total) by mouth every 8 (eight) hours as needed.   No facility-administered encounter medications on file as of 09/06/2017.     Allergies: Patient has no known allergies.  Body mass index is 26.79 kg/m.  Blood pressure 127/70, pulse (!) 55, height 6' 0.75" (1.848 m), weight 201 lb 11.2 oz (91.5 kg), SpO2 99 %.  Review of Systems  Constitutional: Negative for activity change, appetite change, chills, diaphoresis, fatigue, fever and unexpected weight change.  HENT: Negative for congestion.   Eyes: Negative for visual disturbance.  Respiratory: Negative for cough, chest tightness, shortness of breath, wheezing and stridor.   Cardiovascular: Negative for chest pain, palpitations and leg swelling.  Gastrointestinal: Negative for abdominal distention, abdominal pain, blood in stool, constipation, diarrhea, nausea and vomiting.  Endocrine: Negative for cold intolerance, heat intolerance, polydipsia, polyphagia and polyuria.  Genitourinary: Negative for difficulty urinating and flank pain.  Musculoskeletal: Negative for arthralgias, back pain, gait problem, joint swelling, myalgias, neck pain and neck stiffness.  Skin: Negative for color change, pallor, rash and wound.  Neurological: Negative for dizziness and headaches.  Hematological: Does not bruise/bleed easily.  Psychiatric/Behavioral: Negative for behavioral problems, decreased concentration, dysphoric mood, hallucinations, self-injury, sleep disturbance and suicidal ideas. The patient is not nervous/anxious and  is not hyperactive.        Objective:   Physical Exam  Constitutional: He is oriented to person, place, and time. He appears well-developed and well-nourished. No distress.  HENT:  Head: Normocephalic and atraumatic.  Right Ear: External ear normal. Tympanic membrane is not perforated and not bulging. No decreased  hearing is noted.  Left Ear: External ear normal. Tympanic membrane is not perforated and not bulging. No decreased hearing is noted.  Nose: Nose normal. No mucosal edema or rhinorrhea. Right sinus exhibits no maxillary sinus tenderness and no frontal sinus tenderness. Left sinus exhibits no maxillary sinus tenderness and no frontal sinus tenderness.  Mouth/Throat: Oropharynx is clear and moist.  Eyes: Pupils are equal, round, and reactive to light. Conjunctivae and EOM are normal.  Neck: Normal range of motion. Neck supple.  Cardiovascular: Normal rate, regular rhythm, normal heart sounds and intact distal pulses.  No murmur heard. Pulmonary/Chest: Effort normal and breath sounds normal. No stridor. No respiratory distress. He has no wheezes. He has no rales. He exhibits no tenderness.  Abdominal: Soft. Bowel sounds are normal. He exhibits no distension and no mass. There is no tenderness. There is no rebound and no guarding. No hernia.  Musculoskeletal: Normal range of motion. He exhibits no edema, tenderness or deformity.  Lymphadenopathy:    He has no cervical adenopathy.  Neurological: He is alert and oriented to person, place, and time. No cranial nerve deficit.  Skin: Skin is warm and dry. Capillary refill takes less than 2 seconds. No rash noted. He is not diaphoretic. No erythema. No pallor.  Psychiatric: He has a normal mood and affect. His behavior is normal. Judgment and thought content normal.      Assessment & Plan:   1. Healthcare maintenance   2. Elevated LDL cholesterol level     Healthcare maintenance Increase water intake, strive for at least 100 ounces/day.   Follow Heart Healthy diet Increase regular exercise.  Recommend at least 30 minutes daily, 5 days per week of walking, jogging, biking, swimming, YouTube/Pinterest workout videos. Recommend annual follow-up with fasting labs.  Elevated LDL cholesterol level LDL-122  On 08/17/17 Encouraged to increase regular  exercise and reduce saturated fat intake    FOLLOW-UP:  Return in about 1 year (around 09/07/2018) for CPE, Fasting Labs.

## 2017-09-06 ENCOUNTER — Encounter: Payer: Self-pay | Admitting: Adult Health

## 2017-09-06 ENCOUNTER — Ambulatory Visit (INDEPENDENT_AMBULATORY_CARE_PROVIDER_SITE_OTHER): Payer: 59 | Admitting: Adult Health

## 2017-09-06 VITALS — BP 127/70 | HR 55 | Ht 72.75 in | Wt 201.7 lb

## 2017-09-06 DIAGNOSIS — Z0000 Encounter for general adult medical examination without abnormal findings: Secondary | ICD-10-CM

## 2017-09-06 DIAGNOSIS — E7800 Pure hypercholesterolemia, unspecified: Secondary | ICD-10-CM | POA: Diagnosis not present

## 2017-09-06 MED ORDER — IBUPROFEN 600 MG PO TABS: 600.0000 mg | ORAL_TABLET | Freq: Three times a day (TID) | ORAL | 2 refills | Status: DC | PRN

## 2017-09-06 NOTE — Assessment & Plan Note (Signed)
LDL-122  On 08/17/17 Encouraged to increase regular exercise and reduce saturated fat intake

## 2017-09-06 NOTE — Patient Instructions (Signed)
Preventive Care for Adults, Male A healthy lifestyle and preventive care can promote health and wellness. Preventive health guidelines for men include the following key practices:  A routine yearly physical is a good way to check with your health care provider about your health and preventative screening. It is a chance to share any concerns and updates on your health and to receive a thorough exam.  Visit your dentist for a routine exam and preventative care every 6 months. Brush your teeth twice a day and floss once a day. Good oral hygiene prevents tooth decay and gum disease.  The frequency of eye exams is based on your age, health, family medical history, use of contact lenses, and other factors. Follow your health care provider's recommendations for frequency of eye exams.  Eat a healthy diet. Foods such as vegetables, fruits, whole grains, low-fat dairy products, and lean protein foods contain the nutrients you need without too many calories. Decrease your intake of foods high in solid fats, added sugars, and salt. Eat the right amount of calories for you.Get information about a proper diet from your health care provider, if necessary.  Regular physical exercise is one of the most important things you can do for your health. Most adults should get at least 150 minutes of moderate-intensity exercise (any activity that increases your heart rate and causes you to sweat) each week. In addition, most adults need muscle-strengthening exercises on 2 or more days a week.  Maintain a healthy weight. The body mass index (BMI) is a screening tool to identify possible weight problems. It provides an estimate of body fat based on height and weight. Your health care provider can find your BMI and can help you achieve or maintain a healthy weight.For adults 20 years and older:  A BMI below 18.5 is considered underweight.  A BMI of 18.5 to 24.9 is normal.  A BMI of 25 to 29.9 is considered  overweight.  A BMI of 30 and above is considered obese.  Maintain normal blood lipids and cholesterol levels by exercising and minimizing your intake of saturated fat. Eat a balanced diet with plenty of fruit and vegetables. Blood tests for lipids and cholesterol should begin at age 55 and be repeated every 5 years. If your lipid or cholesterol levels are high, you are over 50, or you are at high risk for heart disease, you may need your cholesterol levels checked more frequently.Ongoing high lipid and cholesterol levels should be treated with medicines if diet and exercise are not working.  If you smoke, find out from your health care provider how to quit. If you do not use tobacco, do not start.  Lung cancer screening is recommended for adults aged 90-80 years who are at high risk for developing lung cancer because of a history of smoking. A yearly low-dose CT scan of the lungs is recommended for people who have at least a 30-pack-year history of smoking and are a current smoker or have quit within the past 15 years. A pack year of smoking is smoking an average of 1 pack of cigarettes a day for 1 year (for example: 1 pack a day for 30 years or 2 packs a day for 15 years). Yearly screening should continue until the smoker has stopped smoking for at least 15 years. Yearly screening should be stopped for people who develop a health problem that would prevent them from having lung cancer treatment.  If you choose to drink alcohol, do not have more  than 2 drinks per day. One drink is considered to be 12 ounces (355 mL) of beer, 5 ounces (148 mL) of wine, or 1.5 ounces (44 mL) of liquor.  Avoid use of street drugs. Do not share needles with anyone. Ask for help if you need support or instructions about stopping the use of drugs.  High blood pressure causes heart disease and increases the risk of stroke. Your blood pressure should be checked at least every 1-2 years. Ongoing high blood pressure should be  treated with medicines, if weight loss and exercise are not effective.  If you are 25-60 years old, ask your health care provider if you should take aspirin to prevent heart disease.  Diabetes screening is done by taking a blood sample to check your blood glucose level after you have not eaten for a certain period of time (fasting). If you are not overweight and you do not have risk factors for diabetes, you should be screened once every 3 years starting at age 69. If you are overweight or obese and you are 2-50 years of age, you should be screened for diabetes every year as part of your cardiovascular risk assessment.  Colorectal cancer can be detected and often prevented. Most routine colorectal cancer screening begins at the age of 26 and continues through age 48. However, your health care provider may recommend screening at an earlier age if you have risk factors for colon cancer. On a yearly basis, your health care provider may provide home test kits to check for hidden blood in the stool. Use of a small camera at the end of a tube to directly examine the colon (sigmoidoscopy or colonoscopy) can detect the earliest forms of colorectal cancer. Talk to your health care provider about this at age 20, when routine screening begins. Direct exam of the colon should be repeated every 5-10 years through age 27, unless early forms of precancerous polyps or small growths are found.  People who are at an increased risk for hepatitis B should be screened for this virus. You are considered at high risk for hepatitis B if:  You were born in a country where hepatitis B occurs often. Talk with your health care provider about which countries are considered high risk.  Your parents were born in a high-risk country and you have not received a shot to protect against hepatitis B (hepatitis B vaccine).  You have HIV or AIDS.  You use needles to inject street drugs.  You live with, or have sex with, someone who  has hepatitis B.  You are a man who has sex with other men (MSM).  You get hemodialysis treatment.  You take certain medicines for conditions such as cancer, organ transplantation, and autoimmune conditions.  Hepatitis C blood testing is recommended for all people born from [birth year] through 1965 and any individual with known risks for hepatitis C.  Practice safe sex. Use condoms and avoid high-risk sexual practices to reduce the spread of sexually transmitted infections (STIs). STIs include gonorrhea, chlamydia, syphilis, trichomonas, herpes, HPV, and human immunodeficiency virus (HIV). Herpes, HIV, and HPV are viral illnesses that have no cure. They can result in disability, cancer, and death.  If you are a man who has sex with other men, you should be screened at least once per year for:  HIV.  Urethral, rectal, and pharyngeal infection of gonorrhea, chlamydia, or both.  If you are at risk of being infected with HIV, it is recommended that you take a  prescription medicine daily to prevent HIV infection. This is called preexposure prophylaxis (PrEP). You are considered at risk if:  You are a man who has sex with other men (MSM) and have other risk factors.  You are a heterosexual man, are sexually active, and are at increased risk for HIV infection.  You take drugs by injection.  You are sexually active with a partner who has HIV.  Talk with your health care provider about whether you are at high risk of being infected with HIV. If you choose to begin PrEP, you should first be tested for HIV. You should then be tested every 3 months for as long as you are taking PrEP.  A one-time screening for abdominal aortic aneurysm (AAA) and surgical repair of large AAAs by ultrasound are recommended for men ages 44 to 4 years who are current or former smokers.  Healthy men should no longer receive prostate-specific antigen (PSA) blood tests as part of routine cancer screening. Talk with your health  care provider about prostate cancer screening.  Testicular cancer screening is not recommended for adult males who have no symptoms. Screening includes self-exam, a health care provider exam, and other screening tests. Consult with your health care provider about any symptoms you have or any concerns you have about testicular cancer.  Use sunscreen. Apply sunscreen liberally and repeatedly throughout the day. You should seek shade when your shadow is shorter than you. Protect yourself by wearing long sleeves, pants, a wide-brimmed hat, and sunglasses year round, whenever you are outdoors.  Once a month, do a whole-body skin exam, using a mirror to look at the skin on your back. Tell your health care provider about new moles, moles that have irregular borders, moles that are larger than a pencil eraser, or moles that have changed in shape or color.  Stay current with required vaccines (immunizations).  Influenza vaccine. All adults should be immunized every year.  Tetanus, diphtheria, and acellular pertussis (Td, Tdap) vaccine. An adult who has not previously received Tdap or who does not know his vaccine status should receive 1 dose of Tdap. This initial dose should be followed by tetanus and diphtheria toxoids (Td) booster doses every 10 years. Adults with an unknown or incomplete history of completing a 3-dose immunization series with Td-containing vaccines should begin or complete a primary immunization series including a Tdap dose. Adults should receive a Td booster every 10 years.  Varicella vaccine. An adult without evidence of immunity to varicella should receive 2 doses or a second dose if he has previously received 1 dose.  Human papillomavirus (HPV) vaccine. Males aged 11-21 years who have not received the vaccine previously should receive the 3-dose series. Males aged 22-26 years may be immunized. Immunization is recommended through the age of 41 years for any male who has sex with males  and did not get any or all doses earlier. Immunization is recommended for any person with an immunocompromised condition through the age of 51 years if he did not get any or all doses earlier. During the 3-dose series, the second dose should be obtained 4-8 weeks after the first dose. The third dose should be obtained 24 weeks after the first dose and 16 weeks after the second dose.  Zoster vaccine. One dose is recommended for adults aged 67 years or older unless certain conditions are present.  Measles, mumps, and rubella (MMR) vaccine. Adults born before [birth year] generally are considered immune to measles and mumps. Adults born in [birth year]  or later should have 1 or more doses of MMR vaccine unless there is a contraindication to the vaccine or there is laboratory evidence of immunity to each of the three diseases. A routine second dose of MMR vaccine should be obtained at least 28 days after the first dose for students attending postsecondary schools, health care workers, or international travelers. People who received inactivated measles vaccine or an unknown type of measles vaccine during 1963-1967 should receive 2 doses of MMR vaccine. People who received inactivated mumps vaccine or an unknown type of mumps vaccine before 1979 and are at high risk for mumps infection should consider immunization with 2 doses of MMR vaccine. Unvaccinated health care workers born before [birth year] who lack laboratory evidence of measles, mumps, or rubella immunity or laboratory confirmation of disease should consider measles and mumps immunization with 2 doses of MMR vaccine or rubella immunization with 1 dose of MMR vaccine.  Pneumococcal 13-valent conjugate (PCV13) vaccine. When indicated, a person who is uncertain of his immunization history and has no record of immunization should receive the PCV13 vaccine. All adults 10 years of age and older should receive this vaccine. An adult aged 24 years or older who has certain medical  conditions and has not been previously immunized should receive 1 dose of PCV13 vaccine. This PCV13 should be followed with a dose of pneumococcal polysaccharide (PPSV23) vaccine. Adults who are at high risk for pneumococcal disease should obtain the PPSV23 vaccine at least 8 weeks after the dose of PCV13 vaccine. Adults older than 43 years of age who have normal immune system function should obtain the PPSV23 vaccine dose at least 1 year after the dose of PCV13 vaccine.  Pneumococcal polysaccharide (PPSV23) vaccine. When PCV13 is also indicated, PCV13 should be obtained first. All adults aged 84 years and older should be immunized. An adult younger than age 48 years who has certain medical conditions should be immunized. Any person who resides in a nursing home or long-term care facility should be immunized. An adult smoker should be immunized. People with an immunocompromised condition and certain other conditions should receive both PCV13 and PPSV23 vaccines. People with human immunodeficiency virus (HIV) infection should be immunized as soon as possible after diagnosis. Immunization during chemotherapy or radiation therapy should be avoided. Routine use of PPSV23 vaccine is not recommended for American Indians, [place] Natives, or people younger than 65 years unless there are medical conditions that require PPSV23 vaccine. When indicated, people who have unknown immunization and have no record of immunization should receive PPSV23 vaccine. One-time revaccination 5 years after the first dose of PPSV23 is recommended for people aged 19-64 years who have chronic kidney failure, nephrotic syndrome, asplenia, or immunocompromised conditions. People who received 1-2 doses of PPSV23 before age 20 years should receive another dose of PPSV23 vaccine at age 38 years or later if at least 5 years have passed since the previous dose. Doses of PPSV23 are not needed for people immunized with PPSV23 at or after age 13  years.  Meningococcal vaccine. Adults with asplenia or persistent complement component deficiencies should receive 2 doses of quadrivalent meningococcal conjugate (MenACWY-D) vaccine. The doses should be obtained at least 2 months apart. Microbiologists working with certain meningococcal bacteria, [employer] recruits, people at risk during an outbreak, and people who travel to or live in countries with a high rate of meningitis should be immunized. A first-year college student up through age 62 years who is living in a residence hall should receive a  dose if he did not receive a dose on or after his 16th birthday. Adults who have certain high-risk conditions should receive one or more doses of vaccine.  Hepatitis A vaccine. Adults who wish to be protected from this disease, have chronic liver disease, work with hepatitis A-infected animals, work in hepatitis A research labs, or travel to or work in countries with a high rate of hepatitis A should be immunized. Adults who were previously unvaccinated and who anticipate close contact with an international adoptee during the first 60 days after arrival in the [place] from a country with a high rate of hepatitis A should be immunized.  Hepatitis B vaccine. Adults should be immunized if they wish to be protected from this disease, are under age 30 years and have diabetes, have chronic liver disease, have had more than one sex partner in the past 6 months, may be exposed to blood or other infectious body fluids, are household contacts or sex partners of hepatitis B positive people, are clients or workers in certain care facilities, or travel to or work in countries with a high rate of hepatitis B.  Haemophilus influenzae type b (Hib) vaccine. A previously unvaccinated person with asplenia or sickle cell disease or having a scheduled splenectomy should receive 1 dose of Hib vaccine. Regardless of previous immunization, a recipient of a hematopoietic stem cell  transplant should receive a 3-dose series 6-12 months after his successful transplant. Hib vaccine is not recommended for adults with HIV infection. Preventive Service / Frequency Ages 37 to 24  Blood pressure check.** / Every 3-5 years.  Lipid and cholesterol check.** / Every 5 years beginning at age 33.  Hepatitis C blood test.** / For any individual with known risks for hepatitis C.  Skin self-exam. / Monthly.  Influenza vaccine. / Every year.  Tetanus, diphtheria, and acellular pertussis (Tdap, Td) vaccine.** / Consult your health care provider. 1 dose of Td every 10 years.  Varicella vaccine.** / Consult your health care provider.  HPV vaccine. / 3 doses over 6 months, if 31 or younger.  Measles, mumps, rubella (MMR) vaccine.** / You need at least 1 dose of MMR if you were born in 1957 or later. You may also need a second dose.  Pneumococcal 13-valent conjugate (PCV13) vaccine.** / Consult your health care provider.  Pneumococcal polysaccharide (PPSV23) vaccine.** / 1 to 2 doses if you smoke cigarettes or if you have certain conditions.  Meningococcal vaccine.** / 1 dose if you are age 14 to 35 years and a Market researcher living in a residence hall, or have one of several medical conditions. You may also need additional booster doses.  Hepatitis A vaccine.** / Consult your health care provider.  Hepatitis B vaccine.** / Consult your health care provider.  Haemophilus influenzae type b (Hib) vaccine.** / Consult your health care provider. Ages 71 to 36  Blood pressure check.** / Every year.  Lipid and cholesterol check.** / Every 5 years beginning at age 85.  Lung cancer screening. / Every year if you are aged 13-80 years and have a 30-pack-year history of smoking and currently smoke or have quit within the past 15 years. Yearly screening is stopped once you have quit smoking for at least 15 years or develop a health problem that would prevent you from having  lung cancer treatment.  Fecal occult blood test (FOBT) of stool. / Every year beginning at age 74 and continuing until age 67. You may not have to do  this test if you get a colonoscopy every 10 years.  Flexible sigmoidoscopy** or colonoscopy.** / Every 5 years for a flexible sigmoidoscopy or every 10 years for a colonoscopy beginning at age 69 and continuing until age 52.  Hepatitis C blood test.** / For all people born from [birth year] through 1965 and any individual with known risks for hepatitis C.  Skin self-exam. / Monthly.  Influenza vaccine. / Every year.  Tetanus, diphtheria, and acellular pertussis (Tdap/Td) vaccine.** / Consult your health care provider. 1 dose of Td every 10 years.  Varicella vaccine.** / Consult your health care provider.  Zoster vaccine.** / 1 dose for adults aged 50 years or older.  Measles, mumps, rubella (MMR) vaccine.** / You need at least 1 dose of MMR if you were born in 1957 or later. You may also need a second dose.  Pneumococcal 13-valent conjugate (PCV13) vaccine.** / Consult your health care provider.  Pneumococcal polysaccharide (PPSV23) vaccine.** / 1 to 2 doses if you smoke cigarettes or if you have certain conditions.  Meningococcal vaccine.** / Consult your health care provider.  Hepatitis A vaccine.** / Consult your health care provider.  Hepatitis B vaccine.** / Consult your health care provider.  Haemophilus influenzae type b (Hib) vaccine.** / Consult your health care provider. Ages 54 and over  Blood pressure check.** / Every year.  Lipid and cholesterol check.**/ Every 5 years beginning at age 51.  Lung cancer screening. / Every year if you are aged 49-80 years and have a 30-pack-year history of smoking and currently smoke or have quit within the past 15 years. Yearly screening is stopped once you have quit smoking for at least 15 years or develop a health problem that would prevent you from having lung cancer treatment.  Fecal  occult blood test (FOBT) of stool. / Every year beginning at age 33 and continuing until age 31. You may not have to do this test if you get a colonoscopy every 10 years.  Flexible sigmoidoscopy** or colonoscopy.** / Every 5 years for a flexible sigmoidoscopy or every 10 years for a colonoscopy beginning at age 31 and continuing until age 31.  Hepatitis C blood test.** / For all people born from [birth year] through 1966 and any individual with known risks for hepatitis C.  Abdominal aortic aneurysm (AAA) screening.** / A one-time screening for ages 16 to 59 years who are current or former smokers.  Skin self-exam. / Monthly.  Influenza vaccine. / Every year.  Tetanus, diphtheria, and acellular pertussis (Tdap/Td) vaccine.** / 1 dose of Td every 10 years.  Varicella vaccine.** / Consult your health care provider.  Zoster vaccine.** / 1 dose for adults aged 38 years or older.  Pneumococcal 13-valent conjugate (PCV13) vaccine.** / 1 dose for all adults aged 42 years and older.  Pneumococcal polysaccharide (PPSV23) vaccine.** / 1 dose for all adults aged 4 years and older.  Meningococcal vaccine.** / Consult your health care provider.  Hepatitis A vaccine.** / Consult your health care provider.  Hepatitis B vaccine.** / Consult your health care provider.  Haemophilus influenzae type b (Hib) vaccine.** / Consult your health care provider. **Family history and personal history of risk and conditions may change your health care provider's recommendations.   This information is not intended to replace advice given to you by your health care provider. Make sure you discuss any questions you have with your health care provider.   Document Released: 05/30/2001 Document Revised: 04/24/2014 Document Reviewed: 08/29/2010 Elsevier Interactive Patient Education 2016  [place] refers to food and lifestyle choices that are based on the traditions of  countries located on the [employer]. This way of eating has been shown to help prevent certain conditions and improve outcomes for people who have chronic diseases, like kidney disease and heart disease. What are tips for following this plan? Lifestyle  Cook and eat meals together with your family, when possible.  Drink enough fluid to keep your urine clear or pale yellow.  Be physically active every day. This includes: ? Aerobic exercise like running or swimming. ? Leisure activities like gardening, walking, or housework.  Get 7-8 hours of sleep each night.  If recommended by your health care provider, drink red wine in moderation. This means 1 glass a day for nonpregnant women and 2 glasses a day for men. A glass of wine equals 5 oz (150 mL). Reading food labels  Check the serving size of packaged foods. For foods such as rice and pasta, the serving size refers to the amount of cooked product, not dry.  Check the total fat in packaged foods. Avoid foods that have saturated fat or trans fats.  Check the ingredients list for added sugars, such as corn syrup. Shopping  At the grocery store, buy most of your food from the areas near the walls of the store. This includes: ? Fresh fruits and vegetables (produce). ? Grains, beans, nuts, and seeds. Some of these may be available in unpackaged forms or large amounts (in bulk). ? Fresh seafood. ? Poultry and eggs. ? Low-fat dairy products.  Buy whole ingredients instead of prepackaged foods.  Buy fresh fruits and vegetables in-season from local farmers markets.  Buy frozen fruits and vegetables in resealable bags.  If you do not have access to quality fresh seafood, buy precooked frozen shrimp or canned fish, such as tuna, salmon, or sardines.  Buy small amounts of raw or cooked vegetables, salads, or olives from the deli or salad bar at your store.  Stock your pantry so you always have certain foods on hand, such as olive  oil, canned tuna, canned tomatoes, rice, pasta, and beans. Cooking  Cook foods with extra-virgin olive oil instead of using butter or other vegetable oils.  Have meat as a side dish, and have vegetables or grains as your main dish. This means having meat in small portions or adding small amounts of meat to foods like pasta or stew.  Use beans or vegetables instead of meat in common dishes like chili or lasagna.  Experiment with different cooking methods. Try roasting or broiling vegetables instead of steaming or sauteing them.  Add frozen vegetables to soups, stews, pasta, or rice.  Add nuts or seeds for added healthy fat at each meal. You can add these to yogurt, salads, or vegetable dishes.  Marinate fish or vegetables using olive oil, lemon juice, garlic, and fresh herbs. Meal planning  Plan to eat 1 vegetarian meal one day each week. Try to work up to 2 vegetarian meals, if possible.  Eat seafood 2 or more times a week.  Have healthy snacks readily available, such as: ? Vegetable sticks with hummus. ? [place] yogurt. ? Fruit and nut trail mix.  Eat balanced meals throughout the week. This includes: ? Fruit: 2-3 servings a day ? Vegetables: 4-5 servings a day ? Low-fat dairy: 2 servings a day ? Fish, poultry, or lean meat: 1 serving a day ? Beans and legumes: 2 or more  servings a week ? Nuts and seeds: 1-2 servings a day ? Whole grains: 6-8 servings a day ? Extra-virgin olive oil: 3-4 servings a day  Limit red meat and sweets to only a few servings a month What are my food choices?  Mediterranean diet ? Recommended ? Grains: Whole-grain pasta. Brown rice. Bulgar wheat. Polenta. Couscous. Whole-wheat bread. [place]. ? Vegetables: Artichokes. Beets. Broccoli. Cabbage. Carrots. Eggplant. Green beans. Chard. Kale. Spinach. Onions. Leeks. Peas. Squash. Tomatoes. Peppers. Radishes. ? Fruits: Apples. Apricots. Avocado. Berries. Bananas. Cherries. Dates. Figs. Grapes.  Lemons. Melon. Oranges. Peaches. Plums. Pomegranate. ? Meats and other protein foods: Beans. Almonds. Sunflower seeds. Pine nuts. Peanuts. [place]. Salmon. Scallops. Shrimp. [place]. Tilapia. Clams. Oysters. Eggs. ? Dairy: Low-fat milk. Cheese. Greek yogurt. ? Beverages: Water. Red wine. Herbal tea. ? Fats and oils: Extra virgin olive oil. Avocado oil. Grape seed oil. ? Sweets and desserts: [place] yogurt with honey. Baked apples. Poached pears. Trail mix. ? Seasoning and other foods: Basil. Cilantro. Coriander. Cumin. Mint. Parsley. Sage. Rosemary. Tarragon. Garlic. Oregano. Thyme. Pepper. Balsalmic vinegar. Tahini. Hummus. Tomato sauce. Olives. Mushrooms. ? Limit these ? Grains: Prepackaged pasta or rice dishes. Prepackaged cereal with added sugar. ? Vegetables: Deep fried potatoes (french fries). ? Fruits: Fruit canned in syrup. ? Meats and other protein foods: Beef. Pork. Lamb. Poultry with skin. Hot dogs. [place] Sal[place]. ? Dairy: Ice cream. Sour cream. Whole milk. ? Beverages: Juice. Sugar-sweetened soft drinks. Beer. Liquor and spirits. ? Fats and oils: Butter. Canola oil. Vegetable oil. Beef fat (tallow). Lard. ? Sweets and desserts: Cookies. Cakes. Pies. Candy. ? Seasoning and other foods: Mayonnaise. Premade sauces and marinades. ? The items listed may not be a complete list. Talk with your dietitian about what dietary choices are right for you. Summary  The Mediterranean diet includes both food and lifestyle choices.  Eat a variety of fresh fruits and vegetables, beans, nuts, seeds, and whole grains.  Limit the amount of red meat and sweets that you eat.  Talk with your health care provider about whether it is safe for you to drink red wine in moderation. This means 1 glass a day for nonpregnant women and 2 glasses a day for men. A glass of wine equals 5 oz (150 mL). This information is not intended to replace advice given to you by your health care provider. Make sure you discuss any questions  you have with your health care provider. Document Released: 11/25/2015 Document Revised: 12/28/2015 Document Reviewed: 11/25/2015 Elsevier Interactive Patient Education  2018 [employer].   Increase water intake, strive for at least 100 ounces/day.   Follow Heart Healthy diet Increase regular exercise.  Recommend at least 30 minutes daily, 5 days per week of walking, jogging, biking, swimming, YouTube/Pinterest workout videos. Recommend annual follow-up with fasting labs. NICE TO SEE YOU!

## 2017-09-06 NOTE — Assessment & Plan Note (Signed)
Increase water intake, strive for at least 100 ounces/day.   Follow Heart Healthy diet Increase regular exercise.  Recommend at least 30 minutes daily, 5 days per week of walking, jogging, biking, swimming, YouTube/Pinterest workout videos. Recommend annual follow-up with fasting labs.

## 2018-01-07 ENCOUNTER — Ambulatory Visit (INDEPENDENT_AMBULATORY_CARE_PROVIDER_SITE_OTHER): Payer: 59 | Admitting: Adult Health

## 2018-01-07 ENCOUNTER — Encounter: Payer: Self-pay | Admitting: Adult Health

## 2018-01-07 VITALS — BP 127/72 | HR 91 | Temp 100.0°F | Ht 72.75 in | Wt 199.4 lb

## 2018-01-07 DIAGNOSIS — R6883 Chills (without fever): Secondary | ICD-10-CM | POA: Insufficient documentation

## 2018-01-07 DIAGNOSIS — R509 Fever, unspecified: Secondary | ICD-10-CM

## 2018-01-07 DIAGNOSIS — M791 Myalgia, unspecified site: Secondary | ICD-10-CM

## 2018-01-07 LAB — POCT INFLUENZA A/B
Influenza A, POC: NEGATIVE
Influenza B, POC: NEGATIVE

## 2018-01-07 MED ORDER — FLUTICASONE PROPIONATE 50 MCG/ACT NA SUSP: 1.0000 | Freq: Every day | NASAL | 2 refills | Status: AC

## 2018-01-07 MED ORDER — ONDANSETRON 8 MG PO TBDP: 8.0000 mg | ORAL_TABLET | Freq: Three times a day (TID) | ORAL | 0 refills | Status: DC | PRN

## 2018-01-07 NOTE — Patient Instructions (Addendum)
Bland Diet A bland diet consists of foods that do not have a lot of fat or fiber. Foods without fat or fiber are easier for the body to digest. They are also less likely to irritate your mouth, throat, stomach, and other parts of your gastrointestinal tract. A bland diet is sometimes called a BRAT diet. What is my plan? Your health care provider or dietitian may recommend specific changes to your diet to prevent and treat your symptoms, such as:  Eating small meals often.  Cooking food until it is soft enough to chew easily.  Chewing your food well.  Drinking fluids slowly.  Not eating foods that are very spicy, sour, or fatty.  Not eating citrus fruits, such as oranges and grapefruit.  What do I need to know about this diet?  Eat a variety of foods from the bland diet food list.  Do not follow a bland diet longer than you have to.  Ask your health care provider whether you should take vitamins. What foods can I eat? Grains  Hot cereals, such as cream of wheat. Bread, crackers, or tortillas made from refined white flour. Rice. Vegetables Canned or cooked vegetables. Mashed or boiled potatoes. Fruits Bananas. Applesauce. Other types of cooked or canned fruit with the skin and seeds removed, such as canned peaches or pears. Meats and Other Protein Sources Scrambled eggs. Creamy peanut butter or other nut butters. Lean, well-cooked meats, such as chicken or fish. Tofu. Soups or broths. Dairy Low-fat dairy products, such as milk, cottage cheese, or yogurt. Beverages Water. Herbal tea. Apple juice. Sweets and Desserts Pudding. Custard. Fruit gelatin. Ice cream. Fats and Oils Mild salad dressings. Canola or olive oil. The items listed above may not be a complete list of allowed foods or beverages. Contact your dietitian for more options. What foods are not recommended? Foods and ingredients that are often not recommended include:  Spicy foods, such as hot sauce or  salsa.  Fried foods.  Sour foods, such as pickled or fermented foods.  Raw vegetables or fruits, especially citrus or berries.  Caffeinated drinks.  Alcohol.  Strongly flavored seasonings or condiments.  The items listed above may not be a complete list of foods and beverages that are not allowed. Contact your dietitian for more information. This information is not intended to replace advice given to you by your health care provider. Make sure you discuss any questions you have with your health care provider. Document Released: 07/26/2015 Document Revised: 09/09/2015 Document Reviewed: 04/15/2014 Elsevier Interactive Patient Education  2018 [employer].    Influenza, Adult Influenza ("the flu") is an infection in the lungs, nose, and throat (respiratory tract). It is caused by a virus. The flu causes many common cold symptoms, as well as a high fever and body aches. It can make you feel very sick. The flu spreads easily from person to person (is contagious). Getting a flu shot (influenza vaccination) every year is the best way to prevent the flu. Follow these instructions at home:  Take over-the-counter and prescription medicines only as told by your doctor.  Use a cool mist humidifier to add moisture (humidity) to the air in your home. This can make it easier to breathe.  Rest as needed.  Drink enough fluid to keep your pee (urine) clear or pale yellow.  Cover your mouth and nose when you cough or sneeze.  Wash your hands with soap and water often, especially after you cough or sneeze. If you cannot use soap  and water, use hand sanitizer.  Stay home from work or school as told by your doctor. Unless you are visiting your doctor, try to avoid leaving home until your fever has been gone for 24 hours without the use of medicine.  Keep all follow-up visits as told by your doctor. This is important. How is this prevented?  Getting a yearly (annual) flu shot is the best way to  avoid getting the flu. You may get the flu shot in late summer, fall, or winter. Ask your doctor when you should get your flu shot.  Wash your hands often or use hand sanitizer often.  Avoid contact with people who are sick during cold and flu season.  Eat healthy foods.  Drink plenty of fluids.  Get enough sleep.  Exercise regularly. Contact a doctor if:  You get new symptoms.  You have: ? Chest pain. ? Watery poop (diarrhea). ? A fever.  Your cough gets worse.  You start to have more mucus.  You feel sick to your stomach (nauseous).  You throw up (vomit). Get help right away if:  You start to be short of breath or have trouble breathing.  Your skin or nails turn a bluish color.  You have very bad pain or stiffness in your neck.  You get a sudden headache.  You get sudden pain in your face or ear.  You cannot stop throwing up. This information is not intended to replace advice given to you by your health care provider. Make sure you discuss any questions you have with your health care provider. Document Released: 01/11/2008 Document Revised: 09/09/2015 Document Reviewed: 01/26/2015 Elsevier Interactive Patient Education  2017 [employer].  Flu test Negative Continue to push fluids and rest as often as possible. Continue to alternate OTC Acetaminophen and Ibuprofen as needed for fever/aches. Increase daily vit c-2,000mg /daily when not feeling well. Complete curse of antibiotics, as directed. Work Excuse provided- okay to return 30 Sept 2019. Please call clinic with any questions/concerns. FEEL BETTER!

## 2018-01-07 NOTE — Assessment & Plan Note (Signed)
  Flu test Negative Continue to push fluids and rest as often as possible. Continue to alternate OTC Acetaminophen and Ibuprofen as needed for fever/aches. Increase daily vit c-2,000mg /daily when not feeling well. Complete curse of antibiotics, as directed. Work Excuse provided- okay to return 30 Sept 2019. Please call clinic with any questions/concerns.

## 2018-01-07 NOTE — Assessment & Plan Note (Signed)
Flu Test -

## 2018-01-07 NOTE — Progress Notes (Signed)
Subjective:    Patient ID: Andrew Monroe, male    DOB: [birth year], 42 y.o.   MRN: [id number]  HPI:  Mr. Barstow presents with acute illness- 5 days ago he developed copious thick/yellow nasal drainage and sore throat- he was treated with Amoxicillin 875mg   BID x 10days- he is day 4 of abx course 4 days ago he developed fever (highest 102), chills, body aches, malaise, poor appetite, nausea without vomiting. He denies being around anyone with know flu He has been alternating OTC Acetaminophen (last dose 1230 today) and OTC Ibuprofen (last dose 0700 today) He has not received flu vaccination this year. He denies tobacco use  Patient Care Team    Relationship Specialty Notifications Start End  Julaine Fusi, NP PCP - General Family Medicine  07/23/17     Patient Active Problem List   Diagnosis Date Noted  . Chills 01/07/2018  . Fever 01/07/2018  . Myalgia 01/07/2018  . Elevated LDL cholesterol level 09/06/2017  . Healthcare maintenance 07/23/2017  . SINUSITIS- ACUTE-NOS 07/12/2007  . HIP PAIN, RIGHT 07/12/2007     Past Medical History:  Diagnosis Date  . Allergy   . Arthritis      Past Surgical History:  Procedure Laterality Date  . HIP SURGERY       Family History  Problem Relation Age of Onset  . Arthritis Mother   . Hyperlipidemia Father   . Heart disease Father   . Healthy Sister   . Healthy Brother   . Healthy Son   . Healthy Son      Social History   Substance and Sexual Activity  Drug Use Never     Social History   Substance and Sexual Activity  Alcohol Use Never  . Frequency: Never     Social History   Tobacco Use  Smoking Status Never Smoker  Smokeless Tobacco Never Used     Outpatient Encounter Medications as of 01/07/2018  Medication Sig  . fexofenadine-pseudoephedrine (ALLEGRA-D 24) 180-240 MG 24 hr tablet Take 1 tablet by mouth daily.  . fluticasone (FLONASE) 50 MCG/ACT nasal spray Place 1 spray into both nostrils daily.  Marland Kitchen  ibuprofen (ADVIL,MOTRIN) 600 MG tablet Take 1 tablet (600 mg total) by mouth every 8 (eight) hours as needed.  . niacin (SLO-NIACIN) 500 MG tablet Take 500 mg by mouth at bedtime.  . [DISCONTINUED] fluticasone (FLONASE) 50 MCG/ACT nasal spray Place 1 spray into both nostrils daily.  Marland Kitchen amoxicillin (AMOXIL) 875 MG tablet Take 1 tablet by mouth 2 (two) times daily.  . ondansetron (ZOFRAN-ODT) 8 MG disintegrating tablet Take 1 tablet (8 mg total) by mouth every 8 (eight) hours as needed for nausea or vomiting.   No facility-administered encounter medications on file as of 01/07/2018.     Allergies: Patient has no known allergies.  Body mass index is 26.49 kg/m.  Blood pressure 127/72, pulse 91, temperature 100 F (37.8 C), temperature source Oral, height 6' 0.75" (1.848 m), weight 199 lb 6.4 oz (90.4 kg), SpO2 97 %.  Review of Systems  Constitutional: Positive for activity change, appetite change, chills, diaphoresis, fatigue and fever. Negative for unexpected weight change.  HENT: Positive for congestion, rhinorrhea, sinus pressure and sneezing. Negative for sore throat, trouble swallowing and voice change.   Eyes: Negative for visual disturbance.  Respiratory: Positive for cough. Negative for chest tightness, shortness of breath, wheezing and stridor.   Cardiovascular: Negative for chest pain, palpitations and leg swelling.  Gastrointestinal: Positive for nausea and  vomiting. Negative for abdominal distention, abdominal pain, blood in stool, constipation and diarrhea.  Endocrine: Negative for cold intolerance, heat intolerance, polydipsia, polyphagia and polyuria.  Genitourinary: Negative for difficulty urinating and flank pain.  Neurological: Positive for headaches. Negative for dizziness.  Hematological: Does not bruise/bleed easily.  Psychiatric/Behavioral: Positive for sleep disturbance.       Objective:   Physical Exam  Constitutional: He is oriented to person, place, and time.  He appears well-developed and well-nourished. He appears ill.  HENT:  Head: Normocephalic and atraumatic.  Right Ear: External ear normal. Tympanic membrane is bulging. Tympanic membrane is not erythematous. No decreased hearing is noted.  Left Ear: External ear normal. Tympanic membrane is bulging. Tympanic membrane is not erythematous. No decreased hearing is noted.  Nose: Mucosal edema and rhinorrhea present. Right sinus exhibits no maxillary sinus tenderness and no frontal sinus tenderness. Left sinus exhibits no maxillary sinus tenderness and no frontal sinus tenderness.  Mouth/Throat: Uvula is midline and mucous membranes are normal. Posterior oropharyngeal edema and posterior oropharyngeal erythema present. No oropharyngeal exudate or tonsillar abscesses. Tonsils are 0 on the right. Tonsils are 0 on the left. No tonsillar exudate.  Eyes: Pupils are equal, round, and reactive to light. Conjunctivae and EOM are normal.  Neck: Normal range of motion. Neck supple.  Cardiovascular: Normal rate, regular rhythm, normal heart sounds and intact distal pulses.  No murmur heard. Pulmonary/Chest: Effort normal and breath sounds normal. No stridor. No respiratory distress. He has no wheezes. He exhibits no tenderness.  Neurological: He is alert and oriented to person, place, and time.  Skin: Skin is warm and intact. Capillary refill takes less than 2 seconds. No rash noted. He is diaphoretic. No erythema. No pallor.  Very warm to the touch  Psychiatric: He has a normal mood and affect. His behavior is normal. Judgment and thought content normal.          Assessment & Plan:   1. Fever, unspecified fever cause   2. Chills   3. Myalgia     Fever  Flu test Negative Continue to push fluids and rest as often as possible. Continue to alternate OTC Acetaminophen and Ibuprofen as needed for fever/aches. Increase daily vit c-2,000mg /daily when not feeling well. Complete curse of antibiotics, as  directed. Work Excuse provided- okay to return 30 Sept 2019. Please call clinic with any questions/concerns.  Myalgia Flu Test -    FOLLOW-UP:  Return if symptoms worsen or fail to improve.

## 2018-01-21 ENCOUNTER — Encounter: Payer: Self-pay | Admitting: Adult Health

## 2018-01-21 ENCOUNTER — Ambulatory Visit (INDEPENDENT_AMBULATORY_CARE_PROVIDER_SITE_OTHER): Payer: 59 | Admitting: Adult Health

## 2018-01-21 VITALS — BP 112/69 | HR 89 | Temp 99.6°F | Ht 72.75 in | Wt 199.1 lb

## 2018-01-21 DIAGNOSIS — R509 Fever, unspecified: Secondary | ICD-10-CM | POA: Diagnosis not present

## 2018-01-21 DIAGNOSIS — M2550 Pain in unspecified joint: Secondary | ICD-10-CM

## 2018-01-21 LAB — POCT INFLUENZA A/B
INFLUENZA A, POC: NEGATIVE
INFLUENZA B, POC: NEGATIVE

## 2018-01-21 MED ORDER — ONDANSETRON 8 MG PO TBDP: 8.0000 mg | ORAL_TABLET | Freq: Three times a day (TID) | ORAL | 0 refills | Status: DC | PRN

## 2018-01-21 MED ORDER — DOXYCYCLINE HYCLATE 100 MG PO TABS: 100.0000 mg | ORAL_TABLET | Freq: Two times a day (BID) | ORAL | 0 refills | Status: DC

## 2018-01-21 MED FILL — ONDANSETRON ODT 8 MG TABLET: 8 | 6 days supply | Qty: 20 | Fill #0

## 2018-01-21 MED FILL — DOXYCYCLINE HYCLATE 100 MG: 100 | 10 days supply | Qty: 20 | Fill #0

## 2018-01-21 NOTE — Patient Instructions (Addendum)

## 2018-01-21 NOTE — Assessment & Plan Note (Signed)
Flu Test- Neg  

## 2018-01-21 NOTE — Assessment & Plan Note (Addendum)
Flu Tes Negative Increase fluids/rest/vit c-2,000mg /day Please take Doxycycline 100mg  twice daily, take for 10 days. Continue Ibuprofen as needed for headache and fever. If symptoms persist after this round of antibiotic completed, then please call clinic.

## 2018-01-21 NOTE — Progress Notes (Signed)
Subjective:    Patient ID: Andrew Monroe, male    DOB: 30-May-1975, 42 y.o.   MRN: [id number]  HPI:  01/07/18 OV: Andrew Monroe presents with acute illness- 5 days ago he developed copious thick/yellow nasal drainage and sore throat-  He was treated with Amoxicillin 875mg   BID x 10days- he is on day 4 of abx course 4 days ago he developed fever (highest 102), chills, body aches, malaise, poor appetite, nausea without vomiting. He denies being around anyone with know flu He has been alternating OTC Acetaminophen (last dose 1230 today) and OTC Ibuprofen (last dose 0700 today) He has not received flu vaccination this year. He denies tobacco use  01/21/18 OV: Andrew Monroe completed 10 day Amoxicillin course over 14 days (forgot rx while on vacation causing a disruption in therapy), estimates to have completed on Jan 17, 2018. He reports resolution of sx's then HA,  fever, fatigue, and joint pain developed a few days ago He reports low grade fever in upper 99 He has been using OTC Ibuprofen 600mg  usually 2 doses a day, last dose at bedtime last night- temp this morning 99.6 f He has been increasing water and denies tobacco/ETOH use He denies tick bites or being around anyone acutely ill recently. He denies CP/dyspnea/dizziness/palpiations/N/D,he reports mild, intermittent nausea    Patient Care Team    Relationship Specialty Notifications Start End  Julaine Fusi, NP PCP - General Family Medicine  07/23/17     Patient Active Problem List   Diagnosis Date Noted  . Acute joint pain 01/21/2018  . Chills 01/07/2018  . Fever 01/07/2018  . Myalgia 01/07/2018  . Elevated LDL cholesterol level 09/06/2017  . Healthcare maintenance 07/23/2017  . SINUSITIS- ACUTE-NOS 07/12/2007  . HIP PAIN, RIGHT 07/12/2007     Past Medical History:  Diagnosis Date  . Allergy   . Arthritis      Past Surgical History:  Procedure Laterality Date  . HIP SURGERY       Family History  Problem Relation Age of  Onset  . Arthritis Mother   . Hyperlipidemia Father   . Heart disease Father   . Healthy Sister   . Healthy Brother   . Healthy Son   . Healthy Son      Social History   Substance and Sexual Activity  Drug Use Never     Social History   Substance and Sexual Activity  Alcohol Use Never  . Frequency: Never     Social History   Tobacco Use  Smoking Status Never Smoker  Smokeless Tobacco Never Used     Outpatient Encounter Medications as of 01/21/2018  Medication Sig  . fexofenadine-pseudoephedrine (ALLEGRA-D 24) 180-240 MG 24 hr tablet Take 1 tablet by mouth daily.  . fluticasone (FLONASE) 50 MCG/ACT nasal spray Place 1 spray into both nostrils daily.  Marland Kitchen ibuprofen (ADVIL,MOTRIN) 600 MG tablet Take 1 tablet (600 mg total) by mouth every 8 (eight) hours as needed.  . niacin (SLO-NIACIN) 500 MG tablet Take 500 mg by mouth at bedtime.  . ondansetron (ZOFRAN-ODT) 8 MG disintegrating tablet Take 1 tablet (8 mg total) by mouth every 8 (eight) hours as needed for nausea or vomiting.  . [DISCONTINUED] ondansetron (ZOFRAN-ODT) 8 MG disintegrating tablet Take 1 tablet (8 mg total) by mouth every 8 (eight) hours as needed for nausea or vomiting.  Marland Kitchen doxycycline (VIBRA-TABS) 100 MG tablet Take 1 tablet (100 mg total) by mouth 2 (two) times daily.  . [DISCONTINUED] amoxicillin (  AMOXIL) 875 MG tablet Take 1 tablet by mouth 2 (two) times daily.   No facility-administered encounter medications on file as of 01/21/2018.     Allergies: Patient has no known allergies.  Body mass index is 26.45 kg/m.  Blood pressure 112/69, pulse 89, temperature 99.6 F (37.6 C), temperature source Oral, height 6' 0.75" (1.848 m), weight 199 lb 1.6 oz (90.3 kg), SpO2 100 %.  Review of Systems  Constitutional: Positive for activity change, appetite change, chills, diaphoresis, fatigue and fever. Negative for unexpected weight change.  HENT: Positive for congestion, rhinorrhea, sinus pressure and  sneezing. Negative for sore throat, trouble swallowing and voice change.   Eyes: Negative for visual disturbance.  Respiratory: Negative for cough, chest tightness, shortness of breath, wheezing and stridor.   Cardiovascular: Negative for chest pain, palpitations and leg swelling.  Gastrointestinal: Positive for nausea. Negative for abdominal distention, abdominal pain, blood in stool, constipation, diarrhea and vomiting.  Endocrine: Negative for cold intolerance, heat intolerance, polydipsia, polyphagia and polyuria.  Genitourinary: Negative for difficulty urinating and flank pain.  Neurological: Positive for headaches. Negative for dizziness.  Hematological: Does not bruise/bleed easily.  Psychiatric/Behavioral: Positive for sleep disturbance.       Objective:   Physical Exam  Constitutional: He is oriented to person, place, and time. He appears well-developed and well-nourished. He appears ill. No distress.  HENT:  Head: Normocephalic and atraumatic.  Right Ear: External ear normal. Tympanic membrane is bulging. Tympanic membrane is not erythematous. No decreased hearing is noted.  Left Ear: External ear normal. Tympanic membrane is bulging. Tympanic membrane is not erythematous. No decreased hearing is noted.  Nose: Mucosal edema and rhinorrhea present. Right sinus exhibits no maxillary sinus tenderness and no frontal sinus tenderness. Left sinus exhibits no maxillary sinus tenderness and no frontal sinus tenderness.  Mouth/Throat: Uvula is midline and mucous membranes are normal. Posterior oropharyngeal edema and posterior oropharyngeal erythema present. No oropharyngeal exudate or tonsillar abscesses. Tonsils are 0 on the right. Tonsils are 0 on the left. No tonsillar exudate.  Eyes: Pupils are equal, round, and reactive to light. Conjunctivae and EOM are normal.  Neck: Normal range of motion. Neck supple.  Cardiovascular: Normal rate, regular rhythm, normal heart sounds and intact  distal pulses.  No murmur heard. Pulmonary/Chest: Effort normal and breath sounds normal. No stridor. No respiratory distress. He has no wheezes. He exhibits no tenderness.  Neurological: He is alert and oriented to person, place, and time.  Skin: Skin is warm and intact. Capillary refill takes less than 2 seconds. No rash noted. He is not diaphoretic. No erythema. No pallor.  Very warm to the touch  Psychiatric: He has a normal mood and affect. His behavior is normal. Judgment and thought content normal.  Nursing note and vitals reviewed.     Assessment & Plan:   1. Fever, unspecified fever cause   2. Acute joint pain     Acute joint pain Flu Test - Neg  Fever Flu Tes Negative Increase fluids/rest/vit c-2,000mg /day Please take Doxycycline 100mg  twice daily, take for 10 days. Continue Ibuprofen as needed for headache and fever. If symptoms persist after this round of antibiotic completed, then please call clinic.    FOLLOW-UP:  Return if symptoms worsen or fail to improve.

## 2018-01-22 LAB — CBC WITH DIFFERENTIAL/PLATELET
BASOS: 1 %
Basophils Absolute: 0 10*3/uL (ref 0.0–0.2)
EOS (ABSOLUTE): 0 10*3/uL (ref 0.0–0.4)
EOS: 0 %
HEMATOCRIT: 42.8 % (ref 37.5–51.0)
HEMOGLOBIN: 14.7 g/dL (ref 13.0–17.7)
IMMATURE GRANS (ABS): 0 10*3/uL (ref 0.0–0.1)
IMMATURE GRANULOCYTES: 0 %
LYMPHS: 45 %
Lymphocytes Absolute: 1.8 10*3/uL (ref 0.7–3.1)
MCH: 31.1 pg (ref 26.6–33.0)
MCHC: 34.3 g/dL (ref 31.5–35.7)
MCV: 91 fL (ref 79–97)
MONOCYTES: 15 %
Monocytes Absolute: 0.6 10*3/uL (ref 0.1–0.9)
NEUTROS PCT: 39 %
Neutrophils Absolute: 1.6 10*3/uL (ref 1.4–7.0)
Platelets: 203 10*3/uL (ref 150–450)
RBC: 4.72 x10E6/uL (ref 4.14–5.80)
RDW: 13.2 % (ref 12.3–15.4)
WBC: 4 10*3/uL (ref 3.4–10.8)

## 2018-12-18 NOTE — Progress Notes (Signed)
Subjective:    Patient ID: Andrew Monroe, male    DOB: 02/02/1976, 43 y.o.   MRN: [id number]  HPI:  Mr. Andrew MutaCox is here for CPE  He has greatly increased water intake- >65 oz/day He has been following heart healthy diet and exercising 4-5 days/week: walking, weight,m golf He has lost >5 lbs He continues abstain from tobacco/vapeETOH use Fasting labs obtained today  Healthcare Maintenance: Immunizations-UTD  Patient Care Team    Relationship Specialty Notifications Start End  Andrew Monroe,  D, NP PCP - General Family Medicine  07/23/17     Patient Active Problem List   Diagnosis Date Noted  . Acute joint pain 01/21/2018  . Chills 01/07/2018  . Fever 01/07/2018  . Myalgia 01/07/2018  . Elevated LDL cholesterol level 09/06/2017  . Healthcare maintenance 07/23/2017  . SINUSITIS- ACUTE-NOS 07/12/2007  . HIP PAIN, RIGHT 07/12/2007     Past Medical History:  Diagnosis Date  . Allergy   . Arthritis      Past Surgical History:  Procedure Laterality Date  . HIP SURGERY       Family History  Problem Relation Age of Onset  . Arthritis Mother   . Hyperlipidemia Father   . Heart disease Father   . Healthy Sister   . Healthy Brother   . Healthy Son   . Healthy Son      Social History   Substance and Sexual Activity  Drug Use Never     Social History   Substance and Sexual Activity  Alcohol Use Never  . Frequency: Never     Social History   Tobacco Use  Smoking Status Never Smoker  Smokeless Tobacco Never Used     Outpatient Encounter Medications as of 12/19/2018  Medication Sig  . Ca Carbonate-Mag Hydroxide (ROLAIDS PO) Take 1 tablet by mouth as needed.  . fexofenadine-pseudoephedrine (ALLEGRA-D 24) 180-240 MG 24 hr tablet Take 1 tablet by mouth daily.  . fluticasone (FLONASE) 50 MCG/ACT nasal spray Place 1 spray into both nostrils daily.  Marland Kitchen. ibuprofen (ADVIL,MOTRIN) 600 MG tablet Take 1 tablet (600 mg total) by mouth every 8 (eight) hours as needed.  .  niacin (SLO-NIACIN) 500 MG tablet Take 500 mg by mouth at bedtime.  . [DISCONTINUED] doxycycline (VIBRA-TABS) 100 MG tablet Take 1 tablet (100 mg total) by mouth 2 (two) times daily.  . [DISCONTINUED] ondansetron (ZOFRAN-ODT) 8 MG disintegrating tablet Take 1 tablet (8 mg total) by mouth every 8 (eight) hours as needed for nausea or vomiting.   No facility-administered encounter medications on file as of 12/19/2018.     Allergies: Patient has no known allergies.  Body mass index is 25.82 kg/m.  Blood pressure 129/78, pulse (!) 59, temperature 98.3 F (36.8 C), temperature source Oral, height 6' 0.75" (1.848 m), weight 194 lb 6.4 oz (88.2 kg), SpO2 98 %.   Review of Systems  Constitutional: Positive for fatigue. Negative for activity change, appetite change, chills, diaphoresis, fever and unexpected weight change.  HENT: Negative for congestion.   Eyes: Negative for visual disturbance.  Respiratory: Negative for cough, chest tightness, shortness of breath, wheezing and stridor.   Cardiovascular: Negative for chest pain, palpitations and leg swelling.  Gastrointestinal: Negative for abdominal distention, anal bleeding, blood in stool, constipation, diarrhea, nausea and vomiting.  Endocrine: Negative for cold intolerance, heat intolerance, polydipsia, polyphagia and polyuria.  Genitourinary: Negative for difficulty urinating and flank pain.  Musculoskeletal: Negative for arthralgias, back pain, gait problem, joint swelling, myalgias, neck pain and  neck stiffness.  Skin: Negative for color change, pallor, rash and wound.  Neurological: Negative for dizziness and headaches.  Hematological: Negative for adenopathy. Does not bruise/bleed easily.  Psychiatric/Behavioral: Negative for agitation, behavioral problems, confusion, decreased concentration, dysphoric mood, hallucinations, self-injury, sleep disturbance and suicidal ideas. The patient is not nervous/anxious and is not hyperactive.         Objective:   Physical Exam Vitals signs and nursing note reviewed.  Constitutional:      General: He is not in acute distress.    Appearance: Normal appearance. He is normal weight. He is not ill-appearing, toxic-appearing or diaphoretic.  HENT:     Head: Normocephalic and atraumatic.     Right Ear: Tympanic membrane, ear canal and external ear normal. There is no impacted cerumen.     Left Ear: Tympanic membrane, ear canal and external ear normal. There is no impacted cerumen.     Nose: Nose normal. No congestion.     Mouth/Throat:     Mouth: Mucous membranes are moist.     Pharynx: No oropharyngeal exudate.  Eyes:     Extraocular Movements: Extraocular movements intact.     Conjunctiva/sclera: Conjunctivae normal.     Pupils: Pupils are equal, round, and reactive to light.  Neck:     Musculoskeletal: Normal range of motion and neck supple. No muscular tenderness.  Cardiovascular:     Rate and Rhythm: Normal rate and regular rhythm.     Pulses: Normal pulses.     Heart sounds: No murmur. No friction rub. No gallop.   Pulmonary:     Effort: Pulmonary effort is normal. No respiratory distress.     Breath sounds: Normal breath sounds. No stridor. No wheezing, rhonchi or rales.  Chest:     Chest wall: No tenderness.  Abdominal:     General: Abdomen is flat. Bowel sounds are normal. There is no distension.     Palpations: Abdomen is soft. There is no mass.     Tenderness: There is no abdominal tenderness. There is no right CVA tenderness, left CVA tenderness, guarding or rebound.     Hernia: No hernia is present.  Musculoskeletal: Normal range of motion.  Skin:    General: Skin is warm.     Capillary Refill: Capillary refill takes less than 2 seconds.  Neurological:     Mental Status: He is alert and oriented to person, place, and time.  Psychiatric:        Mood and Affect: Mood normal.        Behavior: Behavior normal.        Thought Content: Thought content normal.         Judgment: Judgment normal.       Assessment & Plan:   1. Healthcare maintenance   2. Elevated LDL cholesterol level     Healthcare maintenance Overall you are doing! Continue to drink plenty of water, follow heart healthy diet and continue regular exercise. We will call you when lab results are available. Continue to social distance and wear a mask when in public.  Elevated LDL cholesterol level Lipids Panel drawn    FOLLOW-UP:  Return in about 1 year (around 12/19/2019) for CPE, Fasting Labs.

## 2018-12-19 ENCOUNTER — Ambulatory Visit (INDEPENDENT_AMBULATORY_CARE_PROVIDER_SITE_OTHER): Payer: 59 | Admitting: Adult Health

## 2018-12-19 ENCOUNTER — Encounter: Payer: Self-pay | Admitting: Adult Health

## 2018-12-19 ENCOUNTER — Other Ambulatory Visit: Payer: Self-pay

## 2018-12-19 VITALS — BP 129/78 | HR 59 | Temp 98.3°F | Ht 72.75 in | Wt 194.4 lb

## 2018-12-19 DIAGNOSIS — E7800 Pure hypercholesterolemia, unspecified: Secondary | ICD-10-CM | POA: Diagnosis not present

## 2018-12-19 DIAGNOSIS — Z0000 Encounter for general adult medical examination without abnormal findings: Secondary | ICD-10-CM

## 2018-12-19 NOTE — Patient Instructions (Addendum)

## 2018-12-19 NOTE — Assessment & Plan Note (Signed)
Overall you are doing! Continue to drink plenty of water, follow heart healthy diet and continue regular exercise. We will call you when lab results are available. Continue to social distance and wear a mask when in public.

## 2018-12-19 NOTE — Assessment & Plan Note (Signed)
Lipids Panel drawn

## 2018-12-20 LAB — CBC WITH DIFFERENTIAL/PLATELET
Basophils Absolute: 0 10*3/uL (ref 0.0–0.2)
Basos: 0 %
EOS (ABSOLUTE): 0.1 10*3/uL (ref 0.0–0.4)
Eos: 2 %
Hematocrit: 43.4 % (ref 37.5–51.0)
Hemoglobin: 14.8 g/dL (ref 13.0–17.7)
Immature Grans (Abs): 0 10*3/uL (ref 0.0–0.1)
Immature Granulocytes: 0 %
Lymphocytes Absolute: 1.4 10*3/uL (ref 0.7–3.1)
Lymphs: 29 %
MCH: 30.9 pg (ref 26.6–33.0)
MCHC: 34.1 g/dL (ref 31.5–35.7)
MCV: 91 fL (ref 79–97)
Monocytes Absolute: 0.4 10*3/uL (ref 0.1–0.9)
Monocytes: 7 %
Neutrophils Absolute: 3 10*3/uL (ref 1.4–7.0)
Neutrophils: 62 %
Platelets: 176 10*3/uL (ref 150–450)
RBC: 4.79 x10E6/uL (ref 4.14–5.80)
RDW: 12.3 % (ref 11.6–15.4)
WBC: 4.9 10*3/uL (ref 3.4–10.8)

## 2018-12-20 LAB — COMPREHENSIVE METABOLIC PANEL
ALT: 16 IU/L (ref 0–44)
AST: 15 IU/L (ref 0–40)
Albumin/Globulin Ratio: 2.2 (ref 1.2–2.2)
Albumin: 4.9 g/dL (ref 4.0–5.0)
Alkaline Phosphatase: 51 IU/L (ref 39–117)
BUN/Creatinine Ratio: 15 (ref 9–20)
BUN: 15 mg/dL (ref 6–24)
Bilirubin Total: 0.9 mg/dL (ref 0.0–1.2)
CO2: 24 mmol/L (ref 20–29)
Calcium: 9.4 mg/dL (ref 8.7–10.2)
Chloride: 101 mmol/L (ref 96–106)
Creatinine, Ser: 0.99 mg/dL (ref 0.76–1.27)
GFR calc Af Amer: 107 mL/min/{1.73_m2} (ref >59)
GFR calc non Af Amer: 92 mL/min/{1.73_m2} (ref >59)
Globulin, Total: 2.2 g/dL (ref 1.5–4.5)
Glucose: 86 mg/dL (ref 65–99)
Potassium: 4 mmol/L (ref 3.5–5.2)
Sodium: 140 mmol/L (ref 134–144)
Total Protein: 7.1 g/dL (ref 6.0–8.5)

## 2018-12-20 LAB — LIPID PANEL
Chol/HDL Ratio: 4.8 ratio (ref 0.0–5.0)
Cholesterol, Total: 195 mg/dL (ref 100–199)
HDL: 41 mg/dL (ref >39)
LDL Chol Calc (NIH): 133 mg/dL — ABNORMAL HIGH (ref 0–99)
Triglycerides: 117 mg/dL (ref 0–149)
VLDL Cholesterol Cal: 21 mg/dL (ref 5–40)

## 2018-12-20 LAB — HEMOGLOBIN A1C
Est. average glucose Bld gHb Est-mCnc: 94 mg/dL
Hgb A1c MFr Bld: 4.9 % (ref 4.8–5.6)

## 2018-12-20 LAB — TSH: TSH: 2.4 u[IU]/mL (ref 0.450–4.500)

## 2018-12-21 ENCOUNTER — Encounter: Payer: Self-pay | Admitting: Adult Health

## 2020-06-28 DIAGNOSIS — Z1152 Encounter for screening for COVID-19: Secondary | ICD-10-CM | POA: Diagnosis not present

## 2020-08-30 ENCOUNTER — Ambulatory Visit (INDEPENDENT_AMBULATORY_CARE_PROVIDER_SITE_OTHER): Payer: 59 | Admitting: Nurse Practitioner

## 2020-08-30 ENCOUNTER — Other Ambulatory Visit: Payer: Self-pay

## 2020-08-30 ENCOUNTER — Encounter: Payer: Self-pay | Admitting: Nurse Practitioner

## 2020-08-30 ENCOUNTER — Other Ambulatory Visit (HOSPITAL_COMMUNITY): Payer: Self-pay

## 2020-08-30 VITALS — BP 124/78 | HR 70 | Temp 97.9°F | Ht 72.5 in | Wt 192.4 lb

## 2020-08-30 DIAGNOSIS — E7800 Pure hypercholesterolemia, unspecified: Secondary | ICD-10-CM

## 2020-08-30 DIAGNOSIS — Z0000 Encounter for general adult medical examination without abnormal findings: Secondary | ICD-10-CM | POA: Diagnosis not present

## 2020-08-30 DIAGNOSIS — M1611 Unilateral primary osteoarthritis, right hip: Secondary | ICD-10-CM | POA: Diagnosis not present

## 2020-08-30 DIAGNOSIS — Z8739 Personal history of other diseases of the musculoskeletal system and connective tissue: Secondary | ICD-10-CM | POA: Insufficient documentation

## 2020-08-30 DIAGNOSIS — Z0001 Encounter for general adult medical examination with abnormal findings: Secondary | ICD-10-CM

## 2020-08-30 DIAGNOSIS — M1612 Unilateral primary osteoarthritis, left hip: Secondary | ICD-10-CM | POA: Diagnosis not present

## 2020-08-30 MED ORDER — CELECOXIB 200 MG PO CAPS
200.0000 mg | ORAL_CAPSULE | Freq: Two times a day (BID) | ORAL | 2 refills | Status: DC
  Filled 2020-08-30 – 2020-09-07 (×2): qty 60, 30d supply, fill #0
  Filled 2020-10-28: qty 60, 30d supply, fill #1
  Filled 2020-12-22: qty 60, 30d supply, fill #2

## 2020-08-30 NOTE — Patient Instructions (Signed)
Preventing Osteoarthritis, Adult Osteoarthritis is a type of arthritis that affects tissue that covers the ends of bones in joints (cartilage). Cartilage acts as a cushion between the bones and helps them move smoothly. Osteoarthritis results when cartilage in the joints gets worn down. This causes the joint to break down over time. Osteoarthritis cannot always be prevented, but you can take steps to lower your risk of developing this condition. Once you have osteoarthritis, it does not go away, so lowering your risk is important. How can this condition affect me? Osteoarthritis can affect any joint and can make movement painful. It usually affects the joints of the hands, spine, hips, knees, or toes. The knee joints are most commonly affected. The condition can cause symptoms such as:  Pain, tenderness, and stiffness in a joint, especially first thing in the morning or after a period of resting.  Trouble moving the affected joint.  A grating or scraping feeling inside the joint when using it. This condition can make it harder to do things that you need or want to do each day. Osteoarthritis in a major joint, such as your knee or hip, can make it painful to walk or exercise. If you develop osteoarthritis in your hands, you might not be able to grip items, twist your hand, or control small movements of your hands and fingers. Osteoarthritis is a degenerative disease. This means that it may get worse over time. What can increase my risk? Some factors may make you more likely to develop osteoarthritis, such as:  Being 46 years of age or older.  Being overweight.  Doing activities that put extra stress on your joints.  Injuring or overusing a joint.  Having previous surgery on a joint.  Having a family history of osteoarthritis. Some risk factors, such as your age and family history, cannot be changed. But you can take steps to control other risk factors and lower your chances of developing  osteoarthritis. What actions can I take to prevent this condition? Activity  Stay active. Doing moderate exercise for 30 minutes, 5 times a week will help you maintain a healthy weight and strengthen the muscles that support your joints. Good exercises for preventing osteoarthritis and protecting your joints include low-impact activities, such as walking, swimming, hiking, yoga, and biking.  Avoid doing too many high-impact exercises and activities that put extra stress on joints. These include activities that involve heavy lifting, gripping, bending, kneeling, and squatting. Osteoarthritis is much more common in people with jobs that require these activities. If your job involves this type of activity, learn ways to reduce the stress on your joints, such as: ? Using proper lifting techniques. ? Taking breaks as needed.  Consider working with a Teacher, English as a foreign language when starting a new physical activity or sport. A coach or trainer can help you: ? Learn proper techniques for the activity. ? Come up with a training plan that is safe and effective for you.  Get treatment for any joint injury. After an injury, do not return to full activity until you have been cleared by your health care provider. An injured joint that does not heal properly is much more likely to develop osteoarthritis.  Maintain good posture while standing and sitting. This reduces stress on the joints of your back, neck, and hips. ? When standing, keep your upper back and neck straight, with your shoulders pulled back. Avoid slouching. ? When sitting, keep your back straight and relax your shoulders. Do not round your shoulders or  pull them backward.      Lifestyle  Lose weight if you are overweight. More weight puts more stress on your joints.  Control your blood sugar. Diabetes may increase your risk of osteoarthritis. Have your blood sugar checked to make sure you are not at risk of developing diabetes. If you already have  diabetes, work closely with your health care provider to keep your blood sugar at a healthy level.  Do not wear high heels. These put stress on your toes, knees, and hips.  Do not use any products that contain nicotine or tobacco, such as cigarettes, e-cigarettes, and chewing tobacco. If you need help quitting, ask your health care provider. Nutrition  Eat a healthy diet that includes foods that are high in vitamin C, vitamin D, and omega-3 fatty acids. ? Get vitamin C from fruits and vegetables. ? Get vitamin D from fish, eggs, and dairy products. Some foods have vitamin D added to them (are fortified). Look for fortified foods like milk, orange juice, and cereals. Spending some time in the sun also increases vitamin D. ? Get omega-3 from cold-water fish, such as cod, mackerel, and sardines. Other sources include:  Fortified eggs.  Soybeans.  Nuts, such as walnuts.  Flax, chia, and hemp seeds.  Check with your health care provider before taking an over-the-counter supplement for bone or joint health, especially if you are taking any medicines. Supplements may interact with some medicines. Also, evidence that taking a supplement will reduce your risk of osteoarthritis is not strong.   Where to find more information  Arthritis Foundation: www.arthritis.org  [employer] of Rheumatology: www.rheumatology.[facility] of Arthritis and Musculoskeletal and Skin Diseases: www.niams.http://www.myers.net/ Contact a health care provider if:  You have pain or swelling in a joint.  You have joint stiffness or you lose full motion at the joint.  Your joint becomes large and knobby.  You have cracking or grinding in a joint.  You have a joint injury. Summary  Osteoarthritis involves the loss of tissue that covers the ends of bones in joints (cartilage). It can affect any joint and can make movement painful.  Osteoarthritis is most common in middle and older age.  Osteoarthritis  cannot always be prevented, but you can take steps to lower your risk of developing this condition.  Eating a healthy diet, losing weight, and staying active may lower your risk of osteoarthritis.  Let your health care provider know if you have a joint injury or joint pain. This information is not intended to replace advice given to you by your health care provider. Make sure you discuss any questions you have with your health care provider. Document Revised: 10/14/2018 Document Reviewed: 07/09/2018 Elsevier Patient Education  2021 [employer].

## 2020-08-30 NOTE — Progress Notes (Signed)
Established Patient Office Visit  Subjective:  Patient ID: Andrew Monroe, male    DOB: 1975-06-20  Age: 45 y.o. MRN: [id number]  CC:  Chief Complaint  Patient presents with  . Annual Exam    HPI Andrew Monroe presents for annual physical. He states that he does have moderate seasonal allergies, both in the spring and in the fall. Does use OTC allegra D and flonase nasal spray to control the symptoms. This combination generally works well.  Has intermittent and moderate right hip pain. As a child, he had Perth's Disease. As a result, he had to have a few reconstructive surgeries on his hip. Takes ibuprofen as needed and as indicated to reduce pain and inflammation. Generally needs to take this two to three times per week. States that he stays very active regardless of pain. He is curious if there might be something a little better to take for the pain when he has it.  The patient denies chest pain, chest pressure, or shortness of breath. He denies headaches or changes in vision. He denies changes in bowel or bladder habits.  He is due to have routine, fasting blood work drawn.  Discussed screening for colon cancer. He states that he would rather wait a year or two for either the colonoscopy or cologuard.   Past Medical History:  Diagnosis Date  . Allergy   . Arthritis     Past Surgical History:  Procedure Laterality Date  . HIP SURGERY      Family History  Problem Relation Age of Onset  . Arthritis Mother   . Hyperlipidemia Father   . Heart disease Father   . Healthy Sister   . Healthy Brother   . Healthy Son   . Healthy Son     Social History   Socioeconomic History  . Marital status: Married    Spouse name: Not on file  . Number of children: Not on file  . Years of education: Not on file  . Highest education level: Not on file  Occupational History  . Not on file  Tobacco Use  . Smoking status: Never Smoker  . Smokeless tobacco: Never Used  Vaping Use  . Vaping  Use: Never used  Substance and Sexual Activity  . Alcohol use: Never  . Drug use: Never  . Sexual activity: Yes    Birth control/protection: I.U.D.  Other Topics Concern  . Not on file  Social History Narrative  . Not on file   Social Determinants of Health   Financial Resource Strain: Not on file  Food Insecurity: Not on file  Transportation Needs: Not on file  Physical Activity: Not on file  Stress: Not on file  Social Connections: Not on file  Intimate Partner Violence: Not on file    Outpatient Medications Prior to Visit  Medication Sig Dispense Refill  . fexofenadine-pseudoephedrine (ALLEGRA-D 24) 180-240 MG 24 hr tablet Take 1 tablet by mouth daily.    . fluticasone (FLONASE) 50 MCG/ACT nasal spray Place 1 spray into both nostrils daily. 16 g 2  . ibuprofen (ADVIL,MOTRIN) 600 MG tablet Take 1 tablet (600 mg total) by mouth every 8 (eight) hours as needed. 90 tablet 2  . niacin (SLO-NIACIN) 500 MG tablet Take 500 mg by mouth at bedtime.    Marland Kitchen omeprazole (PRILOSEC) 20 MG capsule Take 20 mg by mouth daily.    . Ca Carbonate-Mag Hydroxide (ROLAIDS PO) Take 1 tablet by mouth as needed.  No facility-administered medications prior to visit.    No Known Allergies  ROS Review of Systems  Constitutional: Negative for activity change, chills and fever.  HENT: Negative for congestion, postnasal drip, rhinorrhea, sinus pressure and sore throat.   Eyes: Negative.   Respiratory: Negative for cough, chest tightness, shortness of breath and wheezing.   Cardiovascular: Negative for chest pain and palpitations.  Gastrointestinal: Negative for constipation, diarrhea, nausea and vomiting.  Endocrine: Negative for cold intolerance, polydipsia and polyuria.  Genitourinary: Negative for flank pain, frequency, hematuria, penile swelling, scrotal swelling, testicular pain and urgency.  Musculoskeletal: Positive for arthralgias. Negative for back pain and myalgias.       Intermittent  right hip pain.   Skin: Negative for rash.  Allergic/Immunologic: Positive for environmental allergies.  Neurological: Negative for dizziness, weakness and headaches.  Hematological: Negative.   Psychiatric/Behavioral: Negative for dysphoric mood. The patient is not nervous/anxious.   All other systems reviewed and are negative.     Objective:    Physical Exam Vitals and nursing note reviewed.  Constitutional:      Appearance: Normal appearance. He is well-developed.  HENT:     Head: Normocephalic and atraumatic.     Right Ear: Tympanic membrane is erythematous. Tympanic membrane is not bulging.     Left Ear: Ear canal and external ear normal.     Nose: Nose normal. No congestion or rhinorrhea.     Mouth/Throat:     Mouth: Mucous membranes are moist.  Eyes:     Extraocular Movements: Extraocular movements intact.     Conjunctiva/sclera: Conjunctivae normal.     Pupils: Pupils are equal, round, and reactive to light.  Cardiovascular:     Rate and Rhythm: Normal rate and regular rhythm.     Pulses: Normal pulses.     Heart sounds: Normal heart sounds.  Pulmonary:     Effort: Pulmonary effort is normal.     Breath sounds: Normal breath sounds.  Abdominal:     General: Bowel sounds are normal.     Palpations: Abdomen is soft.     Tenderness: There is no abdominal tenderness.  Musculoskeletal:        General: Normal range of motion.     Cervical back: Normal range of motion and neck supple.  Skin:    General: Skin is warm and dry.     Capillary Refill: Capillary refill takes less than 2 seconds.  Neurological:     General: No focal deficit present.     Mental Status: He is alert and oriented to person, place, and time.  Psychiatric:        Mood and Affect: Mood normal.        Behavior: Behavior normal.        Thought Content: Thought content normal.        Judgment: Judgment normal.     Today's Vitals   08/30/20 1456  BP: 124/78  Pulse: 70  Temp: 97.9 F (36.6  C)  SpO2: 99%  Weight: 192 lb 6.4 oz (87.3 kg)  Height: 6' 0.5" (1.842 m)   Body mass index is 25.74 kg/m. Wt Readings from Last 3 Encounters:  08/30/20 192 lb 6.4 oz (87.3 kg)  12/19/18 194 lb 6.4 oz (88.2 kg)  01/21/18 199 lb 1.6 oz (90.3 kg)     Health Maintenance Due  Topic Date Due  . Hepatitis C Screening  Never done  . COLONOSCOPY (Pts 45-82yr Insurance coverage will need to be confirmed)  Never  done  . COVID-19 Vaccine (3 - Booster for Pfizer series) 09/28/2019    There are no preventive care reminders to display for this patient.  Lab Results  Component Value Date   TSH 2.400 12/19/2018   Lab Results  Component Value Date   WBC 4.9 12/19/2018   HGB 14.8 12/19/2018   HCT 43.4 12/19/2018   MCV 91 12/19/2018   PLT 176 12/19/2018   Lab Results  Component Value Date   NA 140 12/19/2018   K 4.0 12/19/2018   CO2 24 12/19/2018   GLUCOSE 86 12/19/2018   BUN 15 12/19/2018   CREATININE 0.99 12/19/2018   BILITOT 0.9 12/19/2018   ALKPHOS 51 12/19/2018   AST 15 12/19/2018   ALT 16 12/19/2018   PROT 7.1 12/19/2018   ALBUMIN 4.9 12/19/2018   CALCIUM 9.4 12/19/2018   Lab Results  Component Value Date   CHOL 195 12/19/2018   Lab Results  Component Value Date   HDL 41 12/19/2018   Lab Results  Component Value Date   LDLCALC 133 (H) 12/19/2018   Lab Results  Component Value Date   TRIG 117 12/19/2018   Lab Results  Component Value Date   CHOLHDL 4.8 12/19/2018   Lab Results  Component Value Date   HGBA1C 4.9 12/19/2018      Assessment & Plan:  1. Encounter for general adult medical examination with abnormal findings Annual health maintenance exam today. Routine, fasting labs drawn during visit today.   2. Primary osteoarthritis of right hip Trial Celebrex 220m twice daily as needed for pain/inflammation of the right hip.  - celecoxib (CELEBREX) 200 MG capsule; Take 1 capsule (200 mg total) by mouth 2 (two) times daily.  Dispense: 60 capsule;  Refill: 2 - CBC - HgB A1c - TSH - Comp Met (CMET) - Lipid Profile  3. History of Perthes disease Has had multiple reconstructive surgeries of right hip as result of Perthes disease. Will do trial of celebrex 2075mtwice daily as needed for pain and inflammation.   4. Elevated LDL cholesterol level Check routine, fasting labs today. Discuss results with patient when results are available.  - CBC - HgB A1c - TSH - Comp Met (CMET) - Lipid Profile  5. Healthcare maintenance Check routine, fasting labs today. Discuss results with patient when results are available.  - CBC - HgB A1c - TSH - Comp Met (CMET) - Lipid Profile  Problem List Items Addressed This Visit      Musculoskeletal and Integument   Primary osteoarthritis of right hip   Relevant Medications   celecoxib (CELEBREX) 200 MG capsule   Other Relevant Orders   CBC   HgB A1c   TSH   Comp Met (CMET)   Lipid Profile     Other   Healthcare maintenance   Relevant Orders   CBC   HgB A1c   TSH   Comp Met (CMET)   Lipid Profile   Elevated LDL cholesterol level   Relevant Orders   CBC   HgB A1c   TSH   Comp Met (CMET)   Lipid Profile   Encounter for general adult medical examination with abnormal findings - Primary   History of Perthes disease      Meds ordered this encounter  Medications  . celecoxib (CELEBREX) 200 MG capsule    Sig: Take 1 capsule (200 mg total) by mouth 2 (two) times daily.    Dispense:  60 capsule    Refill:  2  Order Specific Question:   Supervising Provider    Answer:   Beatrice Lecher D [[id number]    Follow-up: Return in about 1 year (around 08/30/2021) for annual physical .    Ronnell Freshwater, NP

## 2020-08-31 LAB — COMPREHENSIVE METABOLIC PANEL
ALT: 20 IU/L (ref 0–44)
AST: 23 IU/L (ref 0–40)
Albumin/Globulin Ratio: 2.2 (ref 1.2–2.2)
Albumin: 5.1 g/dL — ABNORMAL HIGH (ref 4.0–5.0)
Alkaline Phosphatase: 60 IU/L (ref 44–121)
BUN/Creatinine Ratio: 13 (ref 9–20)
BUN: 17 mg/dL (ref 6–24)
Bilirubin Total: 1.1 mg/dL (ref 0.0–1.2)
CO2: 20 mmol/L (ref 20–29)
Calcium: 10.1 mg/dL (ref 8.7–10.2)
Chloride: 101 mmol/L (ref 96–106)
Creatinine, Ser: 1.34 mg/dL — ABNORMAL HIGH (ref 0.76–1.27)
Globulin, Total: 2.3 g/dL (ref 1.5–4.5)
Glucose: 76 mg/dL (ref 65–99)
Potassium: 4.7 mmol/L (ref 3.5–5.2)
Sodium: 139 mmol/L (ref 134–144)
Total Protein: 7.4 g/dL (ref 6.0–8.5)
eGFR: 67 mL/min/{1.73_m2} (ref >59)

## 2020-08-31 LAB — CBC
Hematocrit: 44.2 % (ref 37.5–51.0)
Hemoglobin: 15.6 g/dL (ref 13.0–17.7)
MCH: 31.9 pg (ref 26.6–33.0)
MCHC: 35.3 g/dL (ref 31.5–35.7)
MCV: 90 fL (ref 79–97)
Platelets: 210 10*3/uL (ref 150–450)
RBC: 4.89 x10E6/uL (ref 4.14–5.80)
RDW: 12.1 % (ref 11.6–15.4)
WBC: 6.2 10*3/uL (ref 3.4–10.8)

## 2020-08-31 LAB — HEMOGLOBIN A1C
Est. average glucose Bld gHb Est-mCnc: 100 mg/dL
Hgb A1c MFr Bld: 5.1 % (ref 4.8–5.6)

## 2020-08-31 LAB — LIPID PANEL
Chol/HDL Ratio: 4.7 ratio (ref 0.0–5.0)
Cholesterol, Total: 202 mg/dL — ABNORMAL HIGH (ref 100–199)
HDL: 43 mg/dL (ref >39)
LDL Chol Calc (NIH): 141 mg/dL — ABNORMAL HIGH (ref 0–99)
Triglycerides: 99 mg/dL (ref 0–149)
VLDL Cholesterol Cal: 18 mg/dL (ref 5–40)

## 2020-08-31 LAB — TSH: TSH: 2.13 u[IU]/mL (ref 0.450–4.500)

## 2020-09-07 ENCOUNTER — Other Ambulatory Visit (HOSPITAL_COMMUNITY): Payer: Self-pay

## 2020-09-21 ENCOUNTER — Encounter: Payer: Self-pay | Admitting: Nurse Practitioner

## 2020-09-21 NOTE — Progress Notes (Signed)
Mild elevation off LDL and total cholesterol . Patient sent message via MyChart. Other labs good.

## 2020-10-28 ENCOUNTER — Other Ambulatory Visit (HOSPITAL_COMMUNITY): Payer: Self-pay

## 2020-12-22 ENCOUNTER — Other Ambulatory Visit (HOSPITAL_COMMUNITY): Payer: Self-pay

## 2021-03-18 ENCOUNTER — Other Ambulatory Visit (HOSPITAL_COMMUNITY): Payer: Self-pay

## 2021-03-18 ENCOUNTER — Other Ambulatory Visit: Payer: Self-pay | Admitting: Nurse Practitioner

## 2021-03-18 DIAGNOSIS — M1611 Unilateral primary osteoarthritis, right hip: Secondary | ICD-10-CM

## 2021-03-21 ENCOUNTER — Other Ambulatory Visit (HOSPITAL_COMMUNITY): Payer: Self-pay

## 2021-03-21 MED ORDER — CELECOXIB 200 MG PO CAPS
200.0000 mg | ORAL_CAPSULE | Freq: Two times a day (BID) | ORAL | 2 refills | Status: DC
  Filled 2021-03-21: qty 60, 30d supply, fill #0
  Filled 2021-06-14: qty 60, 30d supply, fill #1
  Filled 2021-09-05: qty 60, 30d supply, fill #2

## 2021-06-14 ENCOUNTER — Other Ambulatory Visit (HOSPITAL_COMMUNITY): Payer: Self-pay

## 2021-09-04 DIAGNOSIS — S93401A Sprain of unspecified ligament of right ankle, initial encounter: Secondary | ICD-10-CM | POA: Diagnosis not present

## 2021-09-05 ENCOUNTER — Ambulatory Visit (INDEPENDENT_AMBULATORY_CARE_PROVIDER_SITE_OTHER): Payer: 59 | Admitting: Nurse Practitioner

## 2021-09-05 ENCOUNTER — Encounter: Payer: Self-pay | Admitting: Nurse Practitioner

## 2021-09-05 ENCOUNTER — Other Ambulatory Visit (HOSPITAL_COMMUNITY): Payer: Self-pay

## 2021-09-05 VITALS — BP 131/86 | HR 61 | Temp 98.2°F | Ht 72.44 in | Wt 197.0 lb

## 2021-09-05 DIAGNOSIS — S82899D Other fracture of unspecified lower leg, subsequent encounter for closed fracture with routine healing: Secondary | ICD-10-CM | POA: Insufficient documentation

## 2021-09-05 DIAGNOSIS — Z0001 Encounter for general adult medical examination with abnormal findings: Secondary | ICD-10-CM

## 2021-09-05 DIAGNOSIS — M1611 Unilateral primary osteoarthritis, right hip: Secondary | ICD-10-CM

## 2021-09-05 DIAGNOSIS — S82891D Other fracture of right lower leg, subsequent encounter for closed fracture with routine healing: Secondary | ICD-10-CM | POA: Diagnosis not present

## 2021-09-05 DIAGNOSIS — Z0000 Encounter for general adult medical examination without abnormal findings: Secondary | ICD-10-CM

## 2021-09-05 DIAGNOSIS — E7800 Pure hypercholesterolemia, unspecified: Secondary | ICD-10-CM

## 2021-09-05 NOTE — Progress Notes (Signed)
Established patient visit   Patient: Andrew Monroe   DOB: February 06, 1976   46 y.o. Male  MRN: [id number] Visit Date: 09/05/2021   Chief Complaint  Patient presents with   Annual Exam   Subjective    HPI  Annual physical  -due to have routine, fasting labs. Last year, LDL and total cholesterol were mildly elevated.  -colon cancer screen due. Would like to hold off on this for at least another year or two.  -patient was seen per orthopedics yesterday. Turned his ankle while playing basketball. Currently in walking boot. Has avulsion fracture of the ankle. He is expected to be in boot for a week or two then will have to have a soft cast for a few weeks.  -patient has no new concerns or complaints today. denies chest pain, chest pressure, or shortness of breath. He denies headaches or visual disturbances. He denies abdominal pain, nausea, vomiting, or changes in bowel or bladder habits.       Medications: Outpatient Medications Prior to Visit  Medication Sig   celecoxib (CELEBREX) 200 MG capsule Take 1 capsule (200 mg total) by mouth 2 (two) times daily.   fexofenadine-pseudoephedrine (ALLEGRA-D 24) 180-240 MG 24 hr tablet Take 1 tablet by mouth daily.   fluticasone (FLONASE) 50 MCG/ACT nasal spray Place 1 spray into both nostrils daily.   ibuprofen (ADVIL,MOTRIN) 600 MG tablet Take 1 tablet (600 mg total) by mouth every 8 (eight) hours as needed.   niacin (SLO-NIACIN) 500 MG tablet Take 500 mg by mouth at bedtime.   omeprazole (PRILOSEC) 20 MG capsule Take 20 mg by mouth daily.   No facility-administered medications prior to visit.    Review of Systems  Constitutional:  Negative for activity change, chills, fatigue and fever.  HENT:  Negative for congestion, postnasal drip, rhinorrhea, sinus pressure, sinus pain, sneezing and sore throat.   Eyes: Negative.   Respiratory:  Negative for cough, shortness of breath and wheezing.   Cardiovascular:  Negative for chest pain and palpitations.   Gastrointestinal:  Negative for constipation, diarrhea, nausea and vomiting.  Endocrine: Negative for cold intolerance, heat intolerance, polydipsia and polyuria.  Genitourinary:  Negative for dysuria, frequency and urgency.  Musculoskeletal:  Positive for arthralgias and gait problem. Negative for back pain and myalgias.       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  Allergic/Immunologic: Negative for environmental allergies.  Neurological:  Negative for dizziness, weakness and headaches.  Psychiatric/Behavioral:  The patient is not nervous/anxious.      Objective     Today's Vitals   09/05/21 1425 09/05/21 1433  BP: (!) 149/79 131/86  Pulse: 61   Temp: 98.2 F (36.8 C)   SpO2: 99%   Weight: 197 lb (89.4 kg)   Height: 6' 0.44" (1.84 m)    Body mass index is 26.39 kg/m.   BP Readings from Last 3 Encounters:  09/05/21 131/86  08/30/20 124/78  12/19/18 129/78    Wt Readings from Last 3 Encounters:  09/05/21 197 lb (89.4 kg)  08/30/20 192 lb 6.4 oz (87.3 kg)  12/19/18 194 lb 6.4 oz (88.2 kg)    Physical Exam Vitals and nursing note reviewed.  Constitutional:      Appearance: Normal appearance. He is well-developed.  HENT:     Head: Normocephalic and atraumatic.     Right Ear: Tympanic membrane, ear canal and external ear normal.     Left Ear: Tympanic membrane, ear  canal and external ear normal.     Ears:     Comments: Ear wax present in both outer ear canals.     Nose: Nose normal.     Mouth/Throat:     Mouth: Mucous membranes are moist.     Pharynx: Oropharynx is clear.  Eyes:     Extraocular Movements: Extraocular movements intact.     Conjunctiva/sclera: Conjunctivae normal.     Pupils: Pupils are equal, round, and reactive to light.  Cardiovascular:     Rate and Rhythm: Normal rate and regular rhythm.     Pulses: Normal pulses.     Heart sounds: Normal heart sounds.  Pulmonary:     Effort: Pulmonary  effort is normal.     Breath sounds: Normal breath sounds.  Abdominal:     General: Bowel sounds are normal. There is no distension.     Palpations: Abdomen is soft. There is no mass.     Tenderness: There is no abdominal tenderness. There is no right CVA tenderness, left CVA tenderness, guarding or rebound.     Hernia: No hernia is present.  Musculoskeletal:        General: Normal range of motion.     Cervical back: Normal range of motion and neck supple.  Lymphadenopathy:     Cervical: No cervical adenopathy.  Skin:    General: Skin is warm and dry.     Capillary Refill: Capillary refill takes less than 2 seconds.  Neurological:     General: No focal deficit present.     Mental Status: He is alert and oriented to person, place, and time.  Psychiatric:        Mood and Affect: Mood normal.        Behavior: Behavior normal.        Thought Content: Thought content normal.        Judgment: Judgment normal.     Assessment & Plan    1. Encounter for general adult medical examination with abnormal findings Annual physical today. Routine, fasting labs drawn during today's visit.   2. Elevated LDL cholesterol level Recommend patient limit intake of fried and fatty foods. He should increase intake of lean proteins and green leafy vegetables. Adding exercise into daily routine will also be beneficial.    3. Closed avulsion fracture of right ankle with routine healing, subsequent encounter Patient seeing orthopedics for continued evaluation and management.   4. Primary osteoarthritis of right hip Encouraged him to discuss paininflammation of hip with orthopedic provider   5. Healthcare maintenance Routine, fasting labs drawn during today's visit.  - Lipid panel; Future - Hemoglobin A1c; Future - TSH; Future - Comp Met (CMET); Future - CBC with Differential/Platelet; Future - CBC with Differential/Platelet - Comp Met (CMET) - TSH - Hemoglobin A1c - Lipid panel    Problem List  Items Addressed This Visit       Musculoskeletal and Integument   Primary osteoarthritis of right hip   Closed avulsion fracture of ankle with routine healing     Other   Healthcare maintenance   Relevant Orders   Lipid panel (Completed)   Hemoglobin A1c (Completed)   TSH (Completed)   Comp Met (CMET) (Completed)   CBC with Differential/Platelet (Completed)   Elevated LDL cholesterol level   Encounter for general adult medical examination with abnormal findings - Primary     Return in about 1 year (around 09/06/2022) for health maintenance exam, FBW at time of visit along with PSA .  Ronnell Freshwater, NP  [facility] at [facility] [phone] (phone) [phone] (fax)  [place]

## 2021-09-06 LAB — COMPREHENSIVE METABOLIC PANEL
ALT: 21 IU/L (ref 0–44)
AST: 21 IU/L (ref 0–40)
Albumin/Globulin Ratio: 2.7 — ABNORMAL HIGH (ref 1.2–2.2)
Albumin: 5.1 g/dL — ABNORMAL HIGH (ref 4.0–5.0)
Alkaline Phosphatase: 59 IU/L (ref 44–121)
BUN/Creatinine Ratio: 19 (ref 9–20)
BUN: 19 mg/dL (ref 6–24)
Bilirubin Total: 0.9 mg/dL (ref 0.0–1.2)
CO2: 25 mmol/L (ref 20–29)
Calcium: 9.3 mg/dL (ref 8.7–10.2)
Chloride: 101 mmol/L (ref 96–106)
Creatinine, Ser: 1 mg/dL (ref 0.76–1.27)
Globulin, Total: 1.9 g/dL (ref 1.5–4.5)
Glucose: 83 mg/dL (ref 70–99)
Potassium: 4.1 mmol/L (ref 3.5–5.2)
Sodium: 139 mmol/L (ref 134–144)
Total Protein: 7 g/dL (ref 6.0–8.5)
eGFR: 94 mL/min/{1.73_m2} (ref >59)

## 2021-09-06 LAB — CBC WITH DIFFERENTIAL/PLATELET
Basophils Absolute: 0 10*3/uL (ref 0.0–0.2)
Basos: 0 %
EOS (ABSOLUTE): 0.1 10*3/uL (ref 0.0–0.4)
Eos: 2 %
Hematocrit: 44 % (ref 37.5–51.0)
Hemoglobin: 15.2 g/dL (ref 13.0–17.7)
Immature Grans (Abs): 0 10*3/uL (ref 0.0–0.1)
Immature Granulocytes: 0 %
Lymphocytes Absolute: 1.4 10*3/uL (ref 0.7–3.1)
Lymphs: 20 %
MCH: 31.4 pg (ref 26.6–33.0)
MCHC: 34.5 g/dL (ref 31.5–35.7)
MCV: 91 fL (ref 79–97)
Monocytes Absolute: 0.6 10*3/uL (ref 0.1–0.9)
Monocytes: 8 %
Neutrophils Absolute: 4.9 10*3/uL (ref 1.4–7.0)
Neutrophils: 70 %
Platelets: 181 10*3/uL (ref 150–450)
RBC: 4.84 x10E6/uL (ref 4.14–5.80)
RDW: 12.4 % (ref 11.6–15.4)
WBC: 7.1 10*3/uL (ref 3.4–10.8)

## 2021-09-06 LAB — LIPID PANEL
Chol/HDL Ratio: 5 ratio (ref 0.0–5.0)
Cholesterol, Total: 216 mg/dL — ABNORMAL HIGH (ref 100–199)
HDL: 43 mg/dL (ref >39)
LDL Chol Calc (NIH): 151 mg/dL — ABNORMAL HIGH (ref 0–99)
Triglycerides: 119 mg/dL (ref 0–149)
VLDL Cholesterol Cal: 22 mg/dL (ref 5–40)

## 2021-09-06 LAB — HEMOGLOBIN A1C
Est. average glucose Bld gHb Est-mCnc: 91 mg/dL
Hgb A1c MFr Bld: 4.8 % (ref 4.8–5.6)

## 2021-09-06 LAB — TSH: TSH: 1.87 u[IU]/mL (ref 0.450–4.500)

## 2021-09-06 NOTE — Progress Notes (Signed)
Please let the patient know that labs showed a mild elevation of his bad and total cholesterol levels. I Recommend he limit intake of fried and fatty foods. He should increase intake of lean proteins and green leafy vegetables. Adding exercise into daily routine will also be beneficial.  All of his other labs were normal.  Thanks so much.   -HB

## 2021-11-14 ENCOUNTER — Other Ambulatory Visit: Payer: Self-pay | Admitting: Nurse Practitioner

## 2021-11-14 ENCOUNTER — Other Ambulatory Visit (HOSPITAL_COMMUNITY): Payer: Self-pay

## 2021-11-14 DIAGNOSIS — M1611 Unilateral primary osteoarthritis, right hip: Secondary | ICD-10-CM

## 2021-11-14 MED ORDER — CELECOXIB 200 MG PO CAPS
200.0000 mg | ORAL_CAPSULE | Freq: Two times a day (BID) | ORAL | 2 refills | Status: DC
  Filled 2021-11-14: qty 60, 30d supply, fill #0
  Filled 2022-01-31: qty 60, 30d supply, fill #1
  Filled 2022-04-18: qty 60, 30d supply, fill #2

## 2022-01-31 ENCOUNTER — Other Ambulatory Visit (HOSPITAL_COMMUNITY): Payer: Self-pay

## 2022-02-06 ENCOUNTER — Other Ambulatory Visit (HOSPITAL_COMMUNITY): Payer: Self-pay

## 2022-04-20 ENCOUNTER — Other Ambulatory Visit (HOSPITAL_COMMUNITY): Payer: Self-pay

## 2022-04-20 ENCOUNTER — Encounter (HOSPITAL_COMMUNITY): Payer: Self-pay

## 2022-04-25 ENCOUNTER — Other Ambulatory Visit (HOSPITAL_COMMUNITY): Payer: Self-pay

## 2022-07-10 ENCOUNTER — Other Ambulatory Visit: Payer: Self-pay | Admitting: Nurse Practitioner

## 2022-07-10 ENCOUNTER — Other Ambulatory Visit (HOSPITAL_COMMUNITY): Payer: Self-pay

## 2022-07-10 DIAGNOSIS — M1611 Unilateral primary osteoarthritis, right hip: Secondary | ICD-10-CM

## 2022-07-10 MED ORDER — CELECOXIB 200 MG PO CAPS
200.0000 mg | ORAL_CAPSULE | Freq: Two times a day (BID) | ORAL | 2 refills | Status: DC
  Filled 2022-07-10: qty 60, 30d supply, fill #0
  Filled 2022-10-02: qty 60, 30d supply, fill #1
  Filled 2022-11-14: qty 60, 30d supply, fill #2

## 2022-09-28 DIAGNOSIS — M25551 Pain in right hip: Secondary | ICD-10-CM | POA: Diagnosis not present

## 2022-10-11 ENCOUNTER — Ambulatory Visit (INDEPENDENT_AMBULATORY_CARE_PROVIDER_SITE_OTHER): Payer: 59 | Admitting: Family Medicine

## 2022-10-11 ENCOUNTER — Encounter: Payer: Self-pay | Admitting: Family Medicine

## 2022-10-11 VITALS — BP 148/84 | HR 60 | Temp 97.6°F | Ht 73.0 in | Wt 194.0 lb

## 2022-10-11 DIAGNOSIS — Z791 Long term (current) use of non-steroidal anti-inflammatories (NSAID): Secondary | ICD-10-CM | POA: Diagnosis not present

## 2022-10-11 DIAGNOSIS — Z7689 Persons encountering health services in other specified circumstances: Secondary | ICD-10-CM | POA: Diagnosis not present

## 2022-10-11 DIAGNOSIS — Z0000 Encounter for general adult medical examination without abnormal findings: Secondary | ICD-10-CM

## 2022-10-11 DIAGNOSIS — M25551 Pain in right hip: Secondary | ICD-10-CM | POA: Diagnosis not present

## 2022-10-11 DIAGNOSIS — E7800 Pure hypercholesterolemia, unspecified: Secondary | ICD-10-CM

## 2022-10-11 NOTE — Patient Instructions (Signed)
It was nice to see you today,  We addressed the following topics today: - we will let you know the results of your labs when we get them.  - follow up in one year.    Have a great day,  Frederic Jericho, MD

## 2022-10-11 NOTE — Progress Notes (Unsigned)
Complete physical exam  Patient: Andrew Monroe   DOB: 07/01/1975   47 y.o. Male  MRN: [id number]  Subjective:    Chief Complaint  Patient presents with   Annual Exam    Andrew Monroe is a 47 y.o. male who presents today for a complete physical exam. He reports consuming a general diet.  Patient states he is eating healthier recently and has lost a few pounds.  Exercise includes walking or playing golf twice a week and playing basketball.  He generally feels well. He reports sleeping well. He does not have additional problems to discuss today.   Hip arthritis -patient received a steroid injection today at [facility].  The first time he is ever had that.  Feels much better so far.  Patient had injury to his Achilles tendon last year.  Has been slowly recovering from this.  Patient takes Celebrex once daily for pain.  Patient does not want to get a colonoscopy until he is 49.   The 10-year ASCVD risk score (Arnett DK, et al., 2019) is: 4.8%   Most recent fall risk assessment:    10/11/2022    2:21 PM  Fall Risk   Falls in the past year? 0  Risk for fall due to : No Fall Risks  Follow up Falls evaluation completed     Most recent depression screenings:    10/11/2022    2:21 PM 09/05/2021    2:27 PM  PHQ 2/9 Scores  PHQ - 2 Score 0 0  PHQ- 9 Score 0 0    Patient Care Team: Sandre Kitty, MD as PCP - General (Family Medicine)   Outpatient Medications Prior to Visit  Medication Sig   celecoxib (CELEBREX) 200 MG capsule Take 1 capsule (200 mg total) by mouth 2 (two) times daily.   fexofenadine-pseudoephedrine (ALLEGRA-D 24) 180-240 MG 24 hr tablet Take 1 tablet by mouth daily.   fluticasone (FLONASE) 50 MCG/ACT nasal spray Place 1 spray into both nostrils daily.   omeprazole (PRILOSEC) 20 MG capsule Take 20 mg by mouth daily.   [DISCONTINUED] ibuprofen (ADVIL,MOTRIN) 600 MG tablet Take 1 tablet (600 mg total) by mouth every 8 (eight) hours as needed.   [DISCONTINUED] niacin  (SLO-NIACIN) 500 MG tablet Take 500 mg by mouth at bedtime. (Patient not taking: Reported on 10/11/2022)   No facility-administered medications prior to visit.    ROS        Objective:     BP (!) 148/84   Pulse 60   Temp 97.6 F (36.4 C) (Oral)   Ht 6\' 1"  (1.854 m)   Wt 194 lb (88 kg)   SpO2 99%   BMI 25.60 kg/m  {Vitals History (Optional):23777}  Physical Exam General: Alert and oriented HEENT: PERRLA, EOMI, moist oral mucosa, normal TM CV: Regular rate and rhythm, no murmurs Pulmonary: Lungs clear bilaterally GI: Soft, nontender. MSK: Strength equal bilaterally, normal gait Extremity: No pedal edema Skin: No rashes  No results found for any visits on 10/11/22. {Show previous labs (optional):23779}    Assessment & Plan:    Routine Health Maintenance and Physical Exam  Immunization History  Administered Date(s) Administered   PFIZER(Purple Top)SARS-COV-2 Vaccination 04/09/2019, 04/30/2019   Td 07/14/2002, 06/29/2015    Health Maintenance  Topic Date Due   Colonoscopy  Never done   COVID-19 Vaccine (3 - 2023-24 season) 12/16/2021   INFLUENZA VACCINE  11/16/2022   DTaP/Tdap/Td (3 - Tdap) 06/28/2025   HIV Screening  Completed  HPV VACCINES  Aged Out   Hepatitis C Screening  Discontinued    Discussed health benefits of physical activity, and encouraged him to engage in regular exercise appropriate for his age and condition.  Problem List Items Addressed This Visit       Other   Elevated LDL cholesterol level - Primary   Relevant Orders   Lipid panel   Other Visit Diagnoses     Long term (current) use of non-steroidal anti-inflammatories (nsaid)       Relevant Orders   Basic Metabolic Panel (BMET)   Physical exam, annual          Return in about 1 year (around 10/11/2023) for hld.     Sandre Kitty, MD

## 2022-10-12 LAB — LIPID PANEL
Chol/HDL Ratio: 4.3 ratio (ref 0.0–5.0)
Cholesterol, Total: 215 mg/dL — ABNORMAL HIGH (ref 100–199)
HDL: 50 mg/dL (ref >39)
LDL Chol Calc (NIH): 153 mg/dL — ABNORMAL HIGH (ref 0–99)
Triglycerides: 69 mg/dL (ref 0–149)
VLDL Cholesterol Cal: 12 mg/dL (ref 5–40)

## 2022-10-12 LAB — BASIC METABOLIC PANEL

## 2022-10-13 LAB — BASIC METABOLIC PANEL
BUN/Creatinine Ratio: 19 (ref 9–20)
BUN: 20 mg/dL (ref 6–24)
CO2: 17 mmol/L — ABNORMAL LOW (ref 20–29)
Calcium: 9.9 mg/dL (ref 8.7–10.2)
Chloride: 100 mmol/L (ref 96–106)
Creatinine, Ser: 1.04 mg/dL (ref 0.76–1.27)
Glucose: 110 mg/dL — ABNORMAL HIGH (ref 70–99)
Potassium: 5 mmol/L (ref 3.5–5.2)
Sodium: 137 mmol/L (ref 134–144)
eGFR: 89 mL/min/{1.73_m2} (ref >59)

## 2022-10-13 LAB — SPECIMEN STATUS REPORT

## 2023-03-26 ENCOUNTER — Ambulatory Visit (INDEPENDENT_AMBULATORY_CARE_PROVIDER_SITE_OTHER): Payer: 59 | Admitting: Orthopaedic Surgery

## 2023-03-26 ENCOUNTER — Other Ambulatory Visit (INDEPENDENT_AMBULATORY_CARE_PROVIDER_SITE_OTHER): Payer: 59

## 2023-03-26 DIAGNOSIS — M1611 Unilateral primary osteoarthritis, right hip: Secondary | ICD-10-CM

## 2023-03-26 DIAGNOSIS — M25551 Pain in right hip: Secondary | ICD-10-CM | POA: Diagnosis not present

## 2023-03-26 NOTE — Progress Notes (Signed)
Office Visit Note   Patient: Andrew Monroe           Date of Birth: 01-May-1975           MRN: [id number] Visit Date: 03/26/2023              Requested by: Sandre Kitty, MD [address] [place],  [place] [address] PCP: Sandre Kitty, MD   Assessment & Plan: Visit Diagnoses:  1. Pain of right hip   2. Unilateral primary osteoarthritis, right hip     Plan: I went over his x-rays with him in detail.  We talked in length about hip replacement surgery.  I discussed the risks and benefits of the surgery and what to expect from an intraoperative and postoperative standpoint.  All questions and concerns were answered and addressed.  We went over his x-ray findings and I gave him a handout about hip replacement surgery.  We will work on getting this scheduled likely on a Friday toward the end of February because his wife is off on Fridays.  He understands that we will be in touch to schedule surgery.  Follow-Up Instructions: Return for 2 weeks post-op.   Orders:  Orders Placed This Encounter  Procedures   XR HIP UNILAT W OR W/O PELVIS 1V RIGHT   No orders of the defined types were placed in this encounter.     Procedures: No procedures performed   Clinical Data: No additional findings.   Subjective: Chief Complaint  Patient presents with   Right Hip - Pain  The patient is a 47 year old gentleman that I am seeing for the first time.  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  He is referred to me by several of the other cardiologists that have had hip replacement surgery.  He has a diagnosis of severe end-stage arthritis of his right hip that is a result of having [place] these disease as a child.  He did have extensive right hip surgery as a child in [facility].  He does have hip pain on a daily basis.  It is definitely affecting his mobility, his quality of life and his actives daily living.  He has had steroid injections in the  right hip.  He is an athletic individual and continues to work on hip strengthening exercises.  He plays basketball regularly.  At this point he is interested in hip replacement surgery sometime after the first of the year.  He reports daily right hip pain that has been getting worse over the last 12 months.  It is detrimentally affecting his mobility, his quality of life and his actives daily living. He is not obese.  He is not a diabetic.  He does not smoke.  He has no significant active medical issues.  HPI  Review of Systems There is no listed headache, chest pain, shortness of breath, fever, chills, nausea, vomiting  Objective: Vital Signs: There were no vitals taken for this visit.  Physical Exam He is alert and orient x 3 and in no acute distress Ortho Exam Examination of his left hip is normal.  Examination of his right hip shows significant limitations with internal and external rotation.  There is stiffness and pain in the groin with rotation.  He is slightly short on his right side when comparing the right and left sides. Specialty Comments:  No specialty comments available.  Imaging: XR HIP UNILAT W OR  W/O PELVIS 1V RIGHT  Result Date: 03/26/2023 An AP pelvis and lateral right hip shows severe and stage arthritis of the right hip.  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  There is complete loss of joint space and significant irregularity of the femoral head.    PMFS History: Patient Active Problem List   Diagnosis Date Noted   Closed avulsion fracture of ankle with routine healing 09/05/2021   Encounter for general adult medical examination with abnormal findings 08/30/2020   Unilateral primary osteoarthritis, right hip 08/30/2020   History of Perthes disease 08/30/2020   Acute joint pain 01/21/2018   Chills 01/07/2018   Fever 01/07/2018   Myalgia 01/07/2018   Elevated LDL cholesterol level 09/06/2017   Healthcare  maintenance 07/23/2017   SINUSITIS- ACUTE-NOS 07/12/2007   HIP PAIN, RIGHT 07/12/2007   Past Medical History:  Diagnosis Date   Allergy    Arthritis     Family History  Problem Relation Age of Onset   Arthritis Mother    Hyperlipidemia Father    Heart disease Father    Healthy Sister    Healthy Brother    Healthy Son    Healthy Son     Past Surgical History:  Procedure Laterality Date   HIP SURGERY     Social History   Occupational History   Not on file  Tobacco Use   Smoking status: Never   Smokeless tobacco: Never  Vaping Use   Vaping status: Never Used  Substance and Sexual Activity   Alcohol use: Never   Drug use: Never   Sexual activity: Yes    Birth control/protection: I.U.D.

## 2023-05-25 ENCOUNTER — Telehealth: Payer: Self-pay | Admitting: Orthopaedic Surgery

## 2023-05-25 NOTE — Telephone Encounter (Signed)
 [employer] forms received. To [employer].

## 2023-06-04 NOTE — Patient Instructions (Signed)
 SURGICAL WAITING ROOM VISITATION Patients having surgery or a procedure may have no more than 2 support people in the waiting area - these visitors may rotate in the visitor waiting room.   Due to an increase in RSV and influenza rates and associated hospitalizations, children ages 68 and under may not visit patients in [facility] hospitals. If the patient needs to stay at the hospital during part of their recovery, the visitor guidelines for inpatient rooms apply.  PRE-OP VISITATION  Pre-op nurse will coordinate an appropriate time for 1 support person to accompany the patient in pre-op.  This support person may not rotate.  This visitor will be contacted when the time is appropriate for the visitor to come back in the pre-op area.  Please refer to the [facility] website for the visitor guidelines for Inpatients (after your surgery is over and you are in a regular room).  You are not required to quarantine at this time prior to your surgery. However, you must do this: Hand Hygiene often Do NOT share personal items Notify your provider if you are in close contact with someone who has COVID or you develop fever 100.4 or greater, new onset of sneezing, cough, sore throat, shortness of breath or body aches.  If you test positive for Covid or have been in contact with anyone that has tested positive in the last 10 days please notify you surgeon.    Your procedure is scheduled on:  FRIDAY  June 15, 2023  Report to [facility] Main Entrance: [facility] entrance where the [employer] is available.   Report to admitting at:  05:15   AM  Call this number if you have any questions or problems the morning of surgery [phone]  Do not eat food after Midnight the night prior to your surgery/procedure.  After Midnight you may have the following liquids until 04:15 AM DAY OF SURGERY  Clear Liquid Diet Water Black Coffee (sugar ok, NO MILK/CREAM OR CREAMERS)  Tea (sugar ok, NO  MILK/CREAM OR CREAMERS) regular and decaf                             Plain Jell-O  with no fruit (NO RED)                                           Fruit ices (not with fruit pulp, NO RED)                                     Popsicles (NO RED)                                                                  Juice: NO CITRUS JUICES: only apple, WHITE grape, WHITE cranberry Sports drinks like Gatorade or Powerade (NO RED)                   The day of surgery:  Drink ONE (1) Pre-Surgery Clear Ensure at   04:15 AM the morning of surgery. Drink  in one sitting. Do not sip.  This drink was given to you during your hospital pre-op appointment visit. Nothing else to drink after completing the Pre-Surgery Clear Ensure : No candy, chewing gum or throat lozenges.    FOLLOW ANY ADDITIONAL PRE OP INSTRUCTIONS YOU RECEIVED FROM YOUR SURGEON'S OFFICE!!!   Oral Hygiene is also important to reduce your risk of infection.        Remember - BRUSH YOUR TEETH THE MORNING OF SURGERY WITH YOUR REGULAR TOOTHPASTE  Do NOT smoke after Midnight the night before surgery.  STOP TAKING all Vitamins, Herbs and supplements 1 week before your surgery.   Take ONLY these medicines the morning of surgery with A SIP OF WATER:  omeprazole. You may take Allegra-D if needed. You may use your Flonase nasal spray if needed.                     You may not have any metal on your body including  jewelry, and body piercing  Do not wear  lotions, powders, cologne, or deodorant  Men may shave face and neck.  Contacts, Hearing Aids, dentures or bridgework may not be worn into surgery. DENTURES WILL BE REMOVED PRIOR TO SURGERY PLEASE DO NOT APPLY "Poly grip" OR ADHESIVES!!!  You may bring a small overnight bag with you on the day of surgery, only pack items that are not valuable. [facility] IS NOT RESPONSIBLE   FOR VALUABLES THAT ARE LOST OR STOLEN.   Do not bring your home medications to the hospital. The Pharmacy will  dispense medications listed on your medication list to you during your admission in the Hospital.  Please read over the following fact sheets you were given: IF YOU HAVE QUESTIONS ABOUT YOUR PRE-OP INSTRUCTIONS, PLEASE CALL [phone]  Andrew Monifa Blanchette, RN      Pre-operative 5 CHG Bath Instructions   You can play a key role in reducing the risk of infection after surgery. Your skin needs to be as free of germs as possible. You can reduce the number of germs on your skin by washing with CHG (chlorhexidine gluconate) soap before surgery. CHG is an antiseptic soap that kills germs and continues to kill germs even after washing.   DO NOT use if you have an allergy to chlorhexidine/CHG or antibacterial soaps. If your skin becomes reddened or irritated, stop using the CHG and notify one of our RNs at [phone]  Please shower with the CHG soap starting 4 days before surgery using the following schedule: START SHOWERS ON  MONDAY  June 12, 2023  Please keep in mind the following:  DO NOT shave, including legs and underarms, starting the day of your first shower.   You may shave your face at any point before/day of surgery.   Place clean sheets on your bed the day you start using CHG soap. Use a clean washcloth (not used since being washed) for each shower. DO NOT sleep with pets once you start using the CHG.   CHG Shower Instructions:  If you choose to wash your hair and private area, wash first with your normal shampoo/soap.  After you use shampoo/soap, rinse your hair and body thoroughly to remove shampoo/soap residue.  Turn the water OFF and apply about 3 tablespoons (45 ml) of CHG soap to a CLEAN washcloth.  Apply CHG soap ONLY FROM YOUR NECK DOWN TO YOUR TOES (washing for 3-5 minutes)  DO NOT use CHG soap on face,  private areas, open wounds, or sores.  Pay special attention to the area where your surgery is being performed.  If you are having back surgery, having someone wash your back for you may be helpful.  Wait 2 minutes after CHG soap is applied, then you may rinse off the CHG soap.  Pat dry with a clean towel  Put on clean clothes/pajamas   If you choose to wear lotion, please use ONLY the CHG-compatible lotions on the back of this paper.     Additional instructions for the day of surgery: DO NOT APPLY any lotions, deodorants, cologne, or perfumes.   Put on clean/comfortable clothes.  Brush your teeth.  Ask your nurse before applying any prescription medications to the skin.      CHG Compatible Lotions   Aveeno Moisturizing lotion  Cetaphil Moisturizing Cream  Cetaphil Moisturizing Lotion  Clairol Herbal Essence Moisturizing Lotion, Dry Skin  Clairol Herbal Essence Moisturizing Lotion, Extra Dry Skin  Clairol Herbal Essence Moisturizing Lotion, Normal Skin  Curel Age Defying Therapeutic Moisturizing Lotion with Alpha Hydroxy  Curel Extreme Care Body Lotion  Curel Soothing Hands Moisturizing Hand Lotion  Curel Therapeutic Moisturizing Cream, Fragrance-Free  Curel Therapeutic Moisturizing Lotion, Fragrance-Free  Curel Therapeutic Moisturizing Lotion, Original Formula  Eucerin Daily Replenishing Lotion  Eucerin Dry Skin Therapy Plus Alpha Hydroxy Crme  Eucerin Dry Skin Therapy Plus Alpha Hydroxy Lotion  Eucerin Original Crme  Eucerin Original Lotion  Eucerin Plus Crme Eucerin Plus Lotion  Eucerin TriLipid Replenishing Lotion  Keri Anti-Bacterial Hand Lotion  Keri Deep Conditioning Original Lotion Dry Skin Formula Softly Scented  Keri Deep Conditioning Original Lotion, Fragrance Free Sensitive Skin Formula  Keri Lotion Fast Absorbing Fragrance Free Sensitive Skin Formula  Keri Lotion Fast Absorbing Softly Scented Dry Skin Formula  Keri Original Lotion  Keri Skin Renewal  Lotion Keri Silky Smooth Lotion  Keri Silky Smooth Sensitive Skin Lotion  Nivea Body Creamy Conditioning Oil  Nivea Body Extra Enriched Lotion  Nivea Body Original Lotion  Nivea Body Sheer Moisturizing Lotion Nivea Crme  Nivea Skin Firming Lotion  NutraDerm 30 Skin Lotion  NutraDerm Skin Lotion  NutraDerm Therapeutic Skin Cream  NutraDerm Therapeutic Skin Lotion  ProShield Protective Hand Cream  Provon moisturizing lotion   FAILURE TO FOLLOW THESE INSTRUCTIONS MAY RESULT IN THE CANCELLATION OF YOUR SURGERY  PATIENT SIGNATURE_________________________________  NURSE SIGNATURE__________________________________  ________________________________________________________________________        Andrew Monroe    An incentive spirometer is a tool that can help keep your lungs clear and active. This tool measures how well you are filling your lungs with each breath.  Taking long deep breaths may help reverse or decrease the chance of developing breathing (pulmonary) problems (especially infection) following: A long period of time when you are unable to move or be active. BEFORE THE PROCEDURE  If the spirometer includes an indicator to show your best effort, your nurse or respiratory therapist will set it to a desired goal. If possible, sit up straight or lean slightly forward. Try not to slouch. Hold the incentive spirometer in an upright position. INSTRUCTIONS FOR USE  Sit on the edge of your bed if possible, or sit up as far as you can in bed or on a chair. Hold the incentive spirometer in an upright position. Breathe out normally. Place the mouthpiece in your mouth and seal your lips tightly around it. Breathe in slowly and as deeply as possible, raising the piston or the ball toward the top of the column. Hold your breath for 3-5 seconds or for as long as possible. Allow the piston or ball to fall to the bottom of the column. Remove the mouthpiece from your mouth and  breathe out normally. Rest for a few seconds and repeat Steps 1 through 7 at least 10 times every 1-2 hours when you are awake. Take your time and take a few normal breaths between deep breaths. The spirometer may include an indicator to show your best effort. Use the indicator as a goal to work toward during each repetition. After each set of 10 deep breaths, practice coughing to be sure your lungs are clear. If you have an incision (the cut made at the time of surgery), support your incision when coughing by placing a pillow or rolled up towels firmly against it. Once you are able to get out of bed, walk around indoors and cough well. You may stop using the incentive spirometer when instructed by your caregiver.  RISKS AND COMPLICATIONS Take your time so you do not get dizzy or light-headed. If you are in pain, you may need to take or ask for pain medication before doing incentive spirometry. It is harder to take a deep breath if you are having pain. AFTER USE Rest and breathe slowly and easily. It can be helpful to keep track of a log of your progress. Your caregiver can provide you with a simple table to help with this. If you are using the spirometer at home, follow these instructions: SEEK MEDICAL CARE IF:  You are having difficultly using the spirometer. You have trouble using the spirometer as often as instructed. Your pain medication is not giving enough relief while using the spirometer. You develop fever of 100.5 F (38.1 C) or higher.                                                                                                    SEEK IMMEDIATE MEDICAL CARE IF:  You cough up bloody sputum that had not been present before. You develop fever of 102 F (38.9 C) or greater. You develop worsening pain at or near the incision site. MAKE SURE YOU:  Understand these instructions. Will watch your condition.  Will get help right away if you are not doing well or get worse. Document  Released: 08/14/2006 Document Revised: 06/26/2011 Document Reviewed: 10/15/2006 [facility] Patient Information 2014 [place], [place].       WHAT IS A BLOOD TRANSFUSION? Blood Transfusion Information  A transfusion is the replacement of blood or some of its parts. Blood is made up of multiple cells which provide different functions. Red blood cells carry oxygen and are used for blood loss replacement. White blood cells fight against infection. Platelets control bleeding. Plasma helps clot blood. Other blood products are available for specialized needs, such as hemophilia or other clotting disorders. BEFORE THE TRANSFUSION  Who gives blood for transfusions?  Healthy volunteers who are fully evaluated to make sure their blood is safe. This is blood bank blood. Transfusion therapy is the safest it has ever been in the practice of medicine. Before blood is taken from a donor, a complete history is taken to make sure that person has no history of diseases nor engages in risky social behavior (examples are intravenous drug use or sexual activity with multiple partners). The donor's travel history is screened to minimize risk of transmitting infections, such as malaria. The donated blood is tested for signs of infectious diseases, such as HIV and hepatitis. The blood is then tested to be sure it is compatible with you in order to minimize the chance of a transfusion reaction. If you or a relative donates blood, this is often done in anticipation of surgery and is not appropriate for emergency situations. It takes many days to process the donated blood. RISKS AND COMPLICATIONS Although transfusion therapy is very safe and saves many lives, the main dangers of transfusion include:  Getting an infectious disease. Developing a transfusion reaction. This is an allergic reaction to something in the blood you were given. Every precaution is taken to prevent this. The decision to have a blood transfusion has  been considered carefully by your caregiver before blood is given. Blood is not given unless the benefits outweigh the risks. AFTER THE TRANSFUSION Right after receiving a blood transfusion, you will usually feel much better and more energetic. This is especially true if your red blood cells have gotten low (anemic). The transfusion raises the level of the red blood cells which carry oxygen, and this usually causes an energy increase. The nurse administering the transfusion will monitor you carefully for complications. HOME CARE INSTRUCTIONS  No special instructions are needed after a transfusion. You may find your energy is better. Speak with your caregiver about any limitations on activity for underlying diseases you may have. SEEK MEDICAL CARE IF:  Your condition is not improving after your transfusion. You develop redness or irritation at the intravenous (IV) site. SEEK IMMEDIATE MEDICAL CARE IF:  Any of the following symptoms occur over the next 12 hours: Shaking chills. You have a temperature by mouth above 102 F (38.9 C), not controlled by medicine. Chest, back, or muscle pain. People around you feel you are not acting correctly or are confused. Shortness of breath or difficulty breathing. Dizziness and fainting. You get a rash or develop hives. You have a decrease in urine output. Your urine turns a dark color or changes to pink, red, or brown. Any of the following symptoms occur over the next 10 days: You have a temperature by mouth above 102 F (38.9 C), not controlled by medicine. Shortness of breath. Weakness after normal activity. The white part of the eye turns yellow (jaundice). You have  a decrease in the amount of urine or are urinating less often. Your urine turns a dark color or changes to pink, red, or brown. Document Released: 03/31/2000 Document Revised: 06/26/2011 Document Reviewed: 11/18/2007 [facility] Patient Information 2014 [place],  [place].  _______________________________________________________________________        If you would like to see a video about joint replacement:   [email or web address]

## 2023-06-04 NOTE — Progress Notes (Signed)
 COVID Vaccine received:  []  No [x]  Yes Date of any COVID positive Test in last 90 days:  PCP -  Lenor Coffin, MD  Cardiologist -   Chest x-ray -  EKG - no Hx to warrant  Stress Test -  ECHO -  Cardiac Cath -   PCR screen: [x]  Ordered & Completed []   No Order but Needs PROFEND     []   N/A for this surgery  Surgery Plan:  []  Ambulatory   [x]  Outpatient in bed  []  Admit Anesthesia:    []  General  [x]  Spinal  []   Choice []   MAC  Pacemaker / ICD device [x]  No []  Yes   Spinal Cord Stimulator:[x]  No []  Yes       History of Sleep Apnea? [x]  No []  Yes   CPAP used?- [x]  No []  Yes    Does the patient monitor blood sugar?   [x]  N/A   []  No []  Yes  Patient has: [x]  NO Hx DM   []  Pre-DM   []  DM1  []   DM2 Last A1c was:  4.8 on 09-05-21      Blood Thinner / Instructions:  none Aspirin Instructions:  none  ERAS Protocol Ordered: []  No  [x]  Yes PRE-SURGERY [x]  ENSURE  []  G2   Patient is to be NPO after: 0415  Dental hx: []  Dentures:  []  N/A      []  Bridge or Partial:                   []  Loose or Damaged teeth:   Comments: Patient was given the 5 CHG shower / bath instructions for THA surgery along with 2 bottles of the CHG soap. Patient will start this on: 06-11-23. All questions were asked and answered, Patient voiced understanding of this process.   Patient works in the [employer] at [employer].   Activity level: Patient is able / unable to climb a flight of stairs without difficulty; []  No CP  []  No SOB, but would have ___   Patient can / can not perform ADLs without assistance.   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 No other pertinent history   Patient denies shortness of breath, fever, cough and chest pain at PAT appointment.  Patient verbalized understanding and agreement to the Pre-Surgical Instructions that were given to them at this PAT appointment. Patient was also educated of the need to review these PAT instructions again prior to his surgery.I  reviewed the appropriate phone numbers to call if they have any and questions or concerns.

## 2023-06-06 ENCOUNTER — Encounter (HOSPITAL_COMMUNITY)
Admission: RE | Admit: 2023-06-06 | Discharge: 2023-06-06 | Disposition: A | Discharge status: Home / Self Care | Payer: 59 | Source: Ambulatory Visit | Attending: Orthopaedic Surgery | Admitting: Orthopaedic Surgery

## 2023-06-06 ENCOUNTER — Encounter (HOSPITAL_COMMUNITY): Payer: Self-pay

## 2023-06-06 ENCOUNTER — Other Ambulatory Visit: Payer: Self-pay

## 2023-06-06 VITALS — BP 122/73 | Temp 98.6°F | Resp 12 | Ht 72.0 in | Wt 199.0 lb

## 2023-06-06 DIAGNOSIS — Z8739 Personal history of other diseases of the musculoskeletal system and connective tissue: Secondary | ICD-10-CM

## 2023-06-06 DIAGNOSIS — Z01812 Encounter for preprocedural laboratory examination: Secondary | ICD-10-CM | POA: Diagnosis present

## 2023-06-06 DIAGNOSIS — M25551 Pain in right hip: Secondary | ICD-10-CM

## 2023-06-06 DIAGNOSIS — Z01818 Encounter for other preprocedural examination: Secondary | ICD-10-CM

## 2023-06-06 DIAGNOSIS — M1611 Unilateral primary osteoarthritis, right hip: Secondary | ICD-10-CM | POA: Diagnosis not present

## 2023-06-06 HISTORY — DX: Gastro-esophageal reflux disease without esophagitis: K21.9

## 2023-06-06 LAB — BASIC METABOLIC PANEL
Anion gap: 5 (ref 5–15)
BUN: 19 mg/dL (ref 6–20)
CO2: 26 mmol/L (ref 22–32)
Calcium: 9.2 mg/dL (ref 8.9–10.3)
Chloride: 108 mmol/L (ref 98–111)
Creatinine, Ser: 1.39 mg/dL — ABNORMAL HIGH (ref 0.61–1.24)
GFR, Estimated: >60 mL/min (ref >60)
Glucose, Bld: 92 mg/dL (ref 70–99)
Potassium: 4.8 mmol/L (ref 3.5–5.1)
Sodium: 139 mmol/L (ref 135–145)

## 2023-06-06 LAB — CBC
HCT: 42.8 % (ref 39.0–52.0)
Hemoglobin: 14.6 g/dL (ref 13.0–17.0)
MCH: 31.2 pg (ref 26.0–34.0)
MCHC: 34.1 g/dL (ref 30.0–36.0)
MCV: 91.5 fL (ref 80.0–100.0)
Platelets: 186 10*3/uL (ref 150–400)
RBC: 4.68 MIL/uL (ref 4.22–5.81)
RDW: 12.3 % (ref 11.5–15.5)
WBC: 4.5 10*3/uL (ref 4.0–10.5)
nRBC: 0 % (ref 0.0–0.2)

## 2023-06-06 LAB — SURGICAL PCR SCREEN
MRSA, PCR: NEGATIVE
Staphylococcus aureus: NEGATIVE

## 2023-06-14 NOTE — Anesthesia Preprocedure Evaluation (Signed)
 Anesthesia Evaluation    Reviewed: Allergy & Precautions, Patient's Chart, lab work & pertinent test results  Airway Mallampati: II  TM Distance: >3 FB Neck ROM: Full    Dental no notable dental hx.    Pulmonary neg pulmonary ROS   Pulmonary exam normal breath sounds clear to auscultation       Cardiovascular negative cardio ROS Normal cardiovascular exam Rhythm:Regular Rate:Normal     Neuro/Psych negative neurological ROS  negative psych ROS   GI/Hepatic Neg liver ROS,GERD  Medicated and Controlled,,  Endo/Other  negative endocrine ROS    Renal/GU negative Renal ROS  negative genitourinary   Musculoskeletal  (+) Arthritis , Osteoarthritis,    Abdominal   Peds  Hematology negative hematology ROS (+) Hb 14.6, plt 186   Anesthesia Other Findings   Reproductive/Obstetrics negative OB ROS                             Anesthesia Physical Anesthesia Plan  ASA: 1  Anesthesia Plan: Spinal and MAC   Post-op Pain Management: Tylenol PO (pre-op)*   Induction:   PONV Risk Score and Plan: 2 and Propofol infusion and TIVA  Airway Management Planned: Natural Airway and Nasal Cannula  Additional Equipment: None  Intra-op Plan:   Post-operative Plan:   Informed Consent: I have reviewed the patients History and Physical, chart, labs and discussed the procedure including the risks, benefits and alternatives for the proposed anesthesia with the patient or authorized representative who has indicated his/her understanding and acceptance.       Plan Discussed with: CRNA  Anesthesia Plan Comments:        Anesthesia Quick Evaluation

## 2023-06-14 NOTE — H&P (Signed)
 TOTAL HIP ADMISSION H&P  Patient is admitted for right total hip arthroplasty.  Subjective:  Chief Complaint: right hip pain  HPI: Andrew Monroe, 48 y.o. male, has a history of pain and functional disability in the right hip(s) due to arthritis and patient has failed non-surgical conservative treatments for greater than 12 weeks to include NSAID's and/or analgesics and activity modification.  Onset of symptoms was gradual starting 1 years ago with gradually worsening course since that time.The patient noted prior procedures of the hip to include surgery as a child on the right hip due to Perthes  on the right hip(s).  Patient currently rates pain in the right hip at 10 out of 10 with activity. Patient has night pain, worsening of pain with activity and weight bearing, trendelenberg gait, pain that interfers with activities of daily living, and pain with passive range of motion. Patient has evidence of subchondral sclerosis, periarticular osteophytes, and joint space narrowing by imaging studies. This condition presents safety issues increasing the risk of falls. 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 .  There is no current active infection.  Patient Active Problem List   Diagnosis Date Noted   Closed avulsion fracture of ankle with routine healing 09/05/2021   Encounter for general adult medical examination with abnormal findings 08/30/2020   Unilateral primary osteoarthritis, right hip 08/30/2020   History of Perthes disease 08/30/2020   Acute joint pain 01/21/2018   Chills 01/07/2018   Fever 01/07/2018   Myalgia 01/07/2018   Elevated LDL cholesterol level 09/06/2017   Healthcare maintenance 07/23/2017   SINUSITIS- ACUTE-NOS 07/12/2007   HIP PAIN, RIGHT 07/12/2007   Past Medical History:  Diagnosis Date   Allergy    Arthritis    GERD (gastroesophageal reflux disease)     Past Surgical History:  Procedure Laterality Date   HIP SURGERY Right    age 83, hip  surgery d/t Perthes Disease, done at [facility]    No current facility-administered medications for this encounter.   Current Outpatient Medications  Medication Sig Dispense Refill Last Dose/Taking   fexofenadine-pseudoephedrine (ALLEGRA-D 24) 180-240 MG 24 hr tablet Take 1 tablet by mouth daily.   Taking   fluticasone (FLONASE) 50 MCG/ACT nasal spray Place 1 spray into both nostrils daily. (Patient taking differently: Place 1 spray into both nostrils daily as needed for allergies.) 16 g 2 Taking Differently   ibuprofen (ADVIL) 200 MG tablet Take 600 mg by mouth daily. May take a second 600 mg dose in the evening as needed for pain   Taking   niacin (VITAMIN B3) 500 MG tablet Take 500 mg by mouth daily.   Taking   omeprazole (PRILOSEC) 20 MG capsule Take 20 mg by mouth daily.   Taking   triamcinolone (NASACORT ALLERGY 24HR) 55 MCG/ACT AERO nasal inhaler Place 1 spray into the nose daily as needed (allergies).   Taking As Needed   celecoxib (CELEBREX) 200 MG capsule Take 1 capsule (200 mg total) by mouth 2 (two) times daily. (Patient not taking: Reported on 05/31/2023) 60 capsule 2 Not Taking   No Known Allergies  Social History   Tobacco Use   Smoking status: Never   Smokeless tobacco: Never  Substance Use Topics   Alcohol use: Never    Family History  Problem Relation Age of Onset   Arthritis Mother    Hyperlipidemia Father    Heart disease Father    Healthy Sister    Healthy Brother  Healthy Son    Healthy Son      Review of Systems  Objective:  Physical Exam Vitals reviewed.  Constitutional:      Appearance: Normal appearance. He is normal weight.  HENT:     Head: Normocephalic and atraumatic.  Eyes:     Extraocular Movements: Extraocular movements intact.     Pupils: Pupils are equal, round, and reactive to light.  Cardiovascular:     Rate and Rhythm: Normal rate and regular rhythm.     Pulses: Normal pulses.  Pulmonary:     Effort: Pulmonary effort is normal.      Breath sounds: Normal breath sounds.  Abdominal:     Palpations: Abdomen is soft.  Musculoskeletal:     Cervical back: Normal range of motion and neck supple.     Right hip: Tenderness and bony tenderness present. Decreased range of motion. Decreased strength.  Neurological:     Mental Status: He is alert and oriented to person, place, and time.  Psychiatric:        Behavior: Behavior normal.     Vital signs in last 24 hours:    Labs:   Estimated body mass index is 26.99 kg/m as calculated from the following:   Height as of 06/06/23: 6' (1.829 m).   Weight as of 06/06/23: 90.3 kg.   Imaging Review Plain radiographs demonstrate severe degenerative joint disease of the right hip(s). The bone quality appears to be excellent for age and reported activity level.      Assessment/Plan:  End stage arthritis, right hip(s)  The patient history, physical examination, clinical judgement of the provider and imaging studies are consistent with end stage degenerative joint disease of the right hip(s) and total hip arthroplasty is deemed medically necessary. The treatment options including medical management, injection therapy, arthroscopy and arthroplasty were discussed at length. The risks and benefits of total hip arthroplasty were presented and reviewed. The risks due to aseptic loosening, infection, stiffness, dislocation/subluxation,  thromboembolic complications and other imponderables were discussed.  The patient acknowledged the explanation, agreed to proceed with the plan and consent was signed. Patient is being admitted for inpatient treatment for surgery, pain control, PT, OT, prophylactic antibiotics, VTE prophylaxis, progressive ambulation and ADL's and discharge planning.The patient is planning to be discharged home with home health services

## 2023-06-15 ENCOUNTER — Ambulatory Visit (HOSPITAL_COMMUNITY): Payer: Self-pay | Admitting: Anesthesiology

## 2023-06-15 ENCOUNTER — Other Ambulatory Visit (HOSPITAL_COMMUNITY): Payer: Self-pay

## 2023-06-15 ENCOUNTER — Ambulatory Visit (HOSPITAL_BASED_OUTPATIENT_CLINIC_OR_DEPARTMENT_OTHER): Payer: Self-pay | Admitting: Anesthesiology

## 2023-06-15 ENCOUNTER — Other Ambulatory Visit: Payer: Self-pay

## 2023-06-15 ENCOUNTER — Ambulatory Visit (HOSPITAL_COMMUNITY): Payer: 59

## 2023-06-15 ENCOUNTER — Encounter (HOSPITAL_COMMUNITY)
Admission: RE | Disposition: A | Discharge status: Home / Self Care | Payer: Self-pay | Source: Home / Self Care | Attending: Orthopaedic Surgery

## 2023-06-15 ENCOUNTER — Encounter (HOSPITAL_COMMUNITY): Payer: Self-pay | Admitting: Orthopaedic Surgery

## 2023-06-15 ENCOUNTER — Ambulatory Visit (HOSPITAL_COMMUNITY)
Admission: RE | Admit: 2023-06-15 | Discharge: 2023-06-15 | Disposition: A | Discharge status: Home / Self Care | Payer: 59 | Attending: Orthopaedic Surgery | Admitting: Orthopaedic Surgery

## 2023-06-15 DIAGNOSIS — M1611 Unilateral primary osteoarthritis, right hip: Secondary | ICD-10-CM

## 2023-06-15 DIAGNOSIS — M9111 Juvenile osteochondrosis of head of femur [Legg-Calve-Perthes], right leg: Secondary | ICD-10-CM | POA: Diagnosis not present

## 2023-06-15 DIAGNOSIS — Z471 Aftercare following joint replacement surgery: Secondary | ICD-10-CM | POA: Diagnosis not present

## 2023-06-15 DIAGNOSIS — K219 Gastro-esophageal reflux disease without esophagitis: Secondary | ICD-10-CM | POA: Diagnosis not present

## 2023-06-15 DIAGNOSIS — M21751 Unequal limb length (acquired), right femur: Secondary | ICD-10-CM | POA: Insufficient documentation

## 2023-06-15 DIAGNOSIS — Z96641 Presence of right artificial hip joint: Secondary | ICD-10-CM | POA: Diagnosis not present

## 2023-06-15 HISTORY — PX: TOTAL HIP ARTHROPLASTY: SHX124

## 2023-06-15 LAB — TYPE AND SCREEN
ABO/RH(D): O POS
Antibody Screen: NEGATIVE

## 2023-06-15 LAB — ABO/RH: ABO/RH(D): O POS

## 2023-06-15 SURGERY — ARTHROPLASTY, HIP, TOTAL, ANTERIOR APPROACH
Anesthesia: Monitor Anesthesia Care | Site: Hip | Laterality: Right

## 2023-06-15 MED ORDER — STERILE WATER FOR IRRIGATION IR SOLN
Status: DC | PRN
  Administered 2023-06-15: 1000 mL

## 2023-06-15 MED ORDER — KETOROLAC TROMETHAMINE 30 MG/ML IJ SOLN
30.0000 mg | Freq: Once | INTRAMUSCULAR | Status: AC | PRN
  Administered 2023-06-15: 30 mg via INTRAVENOUS

## 2023-06-15 MED ORDER — PHENYLEPHRINE HCL-NACL 20-0.9 MG/250ML-% IV SOLN
INTRAVENOUS | Status: AC
  Filled 2023-06-15: qty 250

## 2023-06-15 MED ORDER — CEFAZOLIN SODIUM-DEXTROSE 2-4 GM/100ML-% IV SOLN
2.0000 g | Freq: Once | INTRAVENOUS | Status: AC
  Administered 2023-06-15: 2 g via INTRAVENOUS

## 2023-06-15 MED ORDER — TRANEXAMIC ACID-NACL 1000-0.7 MG/100ML-% IV SOLN
1000.0000 mg | INTRAVENOUS | Status: AC
  Administered 2023-06-15: 1000 mg via INTRAVENOUS
  Filled 2023-06-15: qty 100

## 2023-06-15 MED ORDER — SODIUM CHLORIDE 0.9 % IR SOLN
Status: DC | PRN
  Administered 2023-06-15: 1000 mL

## 2023-06-15 MED ORDER — HYDROMORPHONE HCL 1 MG/ML IJ SOLN: 0.2500 mg | INTRAMUSCULAR | Status: DC | PRN

## 2023-06-15 MED ORDER — LACTATED RINGERS IV SOLN
INTRAVENOUS | Status: DC
  Administered 2023-06-15: 250 mL via INTRAVENOUS

## 2023-06-15 MED ORDER — PROPOFOL 500 MG/50ML IV EMUL
INTRAVENOUS | Status: DC | PRN
  Administered 2023-06-15: 50 ug/kg/min via INTRAVENOUS
  Administered 2023-06-15: 20 mg via INTRAVENOUS

## 2023-06-15 MED ORDER — POVIDONE-IODINE 10 % EX SWAB
2.0000 | Freq: Once | CUTANEOUS | Status: AC
  Administered 2023-06-15: 2 via TOPICAL

## 2023-06-15 MED ORDER — ORAL CARE MOUTH RINSE: 15.0000 mL | Freq: Once | OROMUCOSAL | Status: AC

## 2023-06-15 MED ORDER — ALBUMIN HUMAN 5 % IV SOLN: INTRAVENOUS | Status: DC | PRN

## 2023-06-15 MED ORDER — PHENYLEPHRINE HCL-NACL 20-0.9 MG/250ML-% IV SOLN
INTRAVENOUS | Status: DC | PRN
  Administered 2023-06-15: 20 ug/min via INTRAVENOUS

## 2023-06-15 MED ORDER — BUPIVACAINE-EPINEPHRINE (PF) 0.25% -1:200000 IJ SOLN
INTRAMUSCULAR | Status: AC
  Filled 2023-06-15: qty 30

## 2023-06-15 MED ORDER — OXYCODONE HCL 5 MG PO TABS
5.0000 mg | ORAL_TABLET | Freq: Four times a day (QID) | ORAL | 0 refills | Status: DC | PRN
  Filled 2023-06-15: qty 30, 8d supply, fill #0

## 2023-06-15 MED ORDER — MIDAZOLAM HCL 2 MG/2ML IJ SOLN
INTRAMUSCULAR | Status: AC
  Filled 2023-06-15: qty 2

## 2023-06-15 MED ORDER — LACTATED RINGERS IV BOLUS
500.0000 mL | Freq: Once | INTRAVENOUS | Status: AC
  Administered 2023-06-15: 500 mL via INTRAVENOUS

## 2023-06-15 MED ORDER — ONDANSETRON HCL 4 MG/2ML IJ SOLN
INTRAMUSCULAR | Status: DC | PRN
  Administered 2023-06-15: 4 mg via INTRAVENOUS

## 2023-06-15 MED ORDER — MIDAZOLAM HCL 5 MG/5ML IJ SOLN
INTRAMUSCULAR | Status: DC | PRN
  Administered 2023-06-15: 2 mg via INTRAVENOUS

## 2023-06-15 MED ORDER — MEPERIDINE HCL 50 MG/ML IJ SOLN: 6.2500 mg | INTRAMUSCULAR | Status: DC | PRN

## 2023-06-15 MED ORDER — CHLORHEXIDINE GLUCONATE 0.12 % MT SOLN
15.0000 mL | Freq: Once | OROMUCOSAL | Status: AC
  Administered 2023-06-15: 15 mL via OROMUCOSAL

## 2023-06-15 MED ORDER — LIDOCAINE HCL (CARDIAC) PF 100 MG/5ML IV SOSY
PREFILLED_SYRINGE | INTRAVENOUS | Status: DC | PRN
  Administered 2023-06-15: 20 mg via INTRAVENOUS

## 2023-06-15 MED ORDER — OXYCODONE HCL 5 MG PO TABS: 5.0000 mg | ORAL_TABLET | Freq: Once | ORAL | Status: DC | PRN

## 2023-06-15 MED ORDER — CEFAZOLIN SODIUM-DEXTROSE 2-4 GM/100ML-% IV SOLN
2.0000 g | INTRAVENOUS | Status: AC
Start: 2023-06-15 — End: 2023-06-15
  Administered 2023-06-15: 2 g via INTRAVENOUS
  Filled 2023-06-15: qty 100

## 2023-06-15 MED ORDER — PROPOFOL 1000 MG/100ML IV EMUL
INTRAVENOUS | Status: AC
  Filled 2023-06-15: qty 100

## 2023-06-15 MED ORDER — EPHEDRINE SULFATE-NACL 50-0.9 MG/10ML-% IV SOSY
PREFILLED_SYRINGE | INTRAVENOUS | Status: DC | PRN
  Administered 2023-06-15 (×4): 5 mg via INTRAVENOUS

## 2023-06-15 MED ORDER — DEXAMETHASONE SODIUM PHOSPHATE 10 MG/ML IJ SOLN
INTRAMUSCULAR | Status: AC
  Filled 2023-06-15: qty 1

## 2023-06-15 MED ORDER — ASPIRIN 81 MG PO CHEW
81.0000 mg | CHEWABLE_TABLET | Freq: Two times a day (BID) | ORAL | 0 refills | Status: DC
  Filled 2023-06-15: qty 30, 15d supply, fill #0

## 2023-06-15 MED ORDER — 0.9 % SODIUM CHLORIDE (POUR BTL) OPTIME
TOPICAL | Status: DC | PRN
  Administered 2023-06-15: 1000 mL

## 2023-06-15 MED ORDER — DEXAMETHASONE SODIUM PHOSPHATE 10 MG/ML IJ SOLN
INTRAMUSCULAR | Status: DC | PRN
  Administered 2023-06-15: 8 mg via INTRAVENOUS

## 2023-06-15 MED ORDER — FENTANYL CITRATE (PF) 100 MCG/2ML IJ SOLN
INTRAMUSCULAR | Status: AC
  Filled 2023-06-15: qty 2

## 2023-06-15 MED ORDER — ONDANSETRON HCL 4 MG/2ML IJ SOLN: 4.0000 mg | Freq: Once | INTRAMUSCULAR | Status: DC | PRN

## 2023-06-15 MED ORDER — KETOROLAC TROMETHAMINE 30 MG/ML IJ SOLN
INTRAMUSCULAR | Status: AC
  Filled 2023-06-15: qty 1

## 2023-06-15 MED ORDER — METHOCARBAMOL 500 MG PO TABS
500.0000 mg | ORAL_TABLET | Freq: Four times a day (QID) | ORAL | 1 refills | Status: AC | PRN
  Filled 2023-06-15: qty 30, 8d supply, fill #0

## 2023-06-15 MED ORDER — ACETAMINOPHEN 500 MG PO TABS
1000.0000 mg | ORAL_TABLET | Freq: Once | ORAL | Status: AC
  Administered 2023-06-15: 1000 mg via ORAL
  Filled 2023-06-15: qty 2

## 2023-06-15 MED ORDER — BUPIVACAINE IN DEXTROSE 0.75-8.25 % IT SOLN
INTRATHECAL | Status: DC | PRN
  Administered 2023-06-15: 2 mL via INTRATHECAL

## 2023-06-15 MED ORDER — FENTANYL CITRATE (PF) 100 MCG/2ML IJ SOLN
INTRAMUSCULAR | Status: DC | PRN
  Administered 2023-06-15: 100 ug via INTRAVENOUS

## 2023-06-15 MED ORDER — CEFAZOLIN SODIUM-DEXTROSE 2-4 GM/100ML-% IV SOLN
INTRAVENOUS | Status: AC
  Filled 2023-06-15: qty 100

## 2023-06-15 MED ORDER — OXYCODONE HCL 5 MG/5ML PO SOLN: 5.0000 mg | Freq: Once | ORAL | Status: DC | PRN

## 2023-06-15 MED ORDER — AMISULPRIDE (ANTIEMETIC) 5 MG/2ML IV SOLN: 10.0000 mg | Freq: Once | INTRAVENOUS | Status: DC | PRN

## 2023-06-15 SURGICAL SUPPLY — 39 items
BAG COUNTER SPONGE SURGICOUNT (BAG) ×1 IMPLANT
BAG ZIPLOCK 12X15 (MISCELLANEOUS) IMPLANT
BENZOIN TINCTURE PRP APPL 2/3 (GAUZE/BANDAGES/DRESSINGS) IMPLANT
BLADE SAW SGTL 18X1.27X75 (BLADE) ×1 IMPLANT
COVER PERINEAL POST (MISCELLANEOUS) ×1 IMPLANT
COVER SURGICAL LIGHT HANDLE (MISCELLANEOUS) ×1 IMPLANT
CUP ACET PNNCL SECTR W/GRIP 56 (Hips) IMPLANT
DRAPE FOOT SWITCH (DRAPES) ×1 IMPLANT
DRAPE STERI IOBAN 125X83 (DRAPES) ×1 IMPLANT
DRAPE U-SHAPE 47X51 STRL (DRAPES) ×2 IMPLANT
DRSG AQUACEL AG ADV 3.5X10 (GAUZE/BANDAGES/DRESSINGS) ×1 IMPLANT
DURAPREP 26ML APPLICATOR (WOUND CARE) ×1 IMPLANT
ELECT REM PT RETURN 15FT ADLT (MISCELLANEOUS) ×1 IMPLANT
FEM STEM 12/14 TAPER SZ 4 HIP (Orthopedic Implant) ×1 IMPLANT
FEMORAL STEM 12/14 TPR SZ4 HIP (Orthopedic Implant) IMPLANT
GAUZE XEROFORM 1X8 LF (GAUZE/BANDAGES/DRESSINGS) IMPLANT
GLOVE BIO SURGEON STRL SZ7.5 (GLOVE) ×1 IMPLANT
GLOVE BIOGEL PI IND STRL 8 (GLOVE) ×2 IMPLANT
GLOVE ECLIPSE 8.0 STRL XLNG CF (GLOVE) ×1 IMPLANT
GOWN STRL REUS W/ TWL XL LVL3 (GOWN DISPOSABLE) ×2 IMPLANT
HEAD CERAMIC DELTA 36 PLUS 1.5 (Hips) IMPLANT
HOLDER FOLEY CATH W/STRAP (MISCELLANEOUS) ×1 IMPLANT
KIT TURNOVER KIT A (KITS) IMPLANT
PACK ANTERIOR HIP CUSTOM (KITS) ×1 IMPLANT
PINN SECTOR W/GRIP ACE CUP 56 (Hips) ×1 IMPLANT
PINNACLE ALTRX PLUS 4 N 36X56 (Hips) IMPLANT
SCREW 6.5MMX25MM (Screw) IMPLANT
SET HNDPC FAN SPRY TIP SCT (DISPOSABLE) ×1 IMPLANT
STAPLER SKIN PROX WIDE 3.9 (STAPLE) IMPLANT
STRIP CLOSURE SKIN 1/2X4 (GAUZE/BANDAGES/DRESSINGS) IMPLANT
SUT ETHIBOND NAB CT1 #1 30IN (SUTURE) ×1 IMPLANT
SUT ETHILON 2 0 PS N (SUTURE) IMPLANT
SUT MNCRL AB 4-0 PS2 18 (SUTURE) IMPLANT
SUT VIC AB 0 CT1 36 (SUTURE) ×1 IMPLANT
SUT VIC AB 1 CT1 36 (SUTURE) ×1 IMPLANT
SUT VIC AB 2-0 CT1 TAPERPNT 27 (SUTURE) ×2 IMPLANT
TRAY CATH INTERMITTENT SS 16FR (CATHETERS) IMPLANT
TRAY FOLEY MTR SLVR 16FR STAT (SET/KITS/TRAYS/PACK) IMPLANT
YANKAUER SUCT BULB TIP NO VENT (SUCTIONS) ×1 IMPLANT

## 2023-06-15 NOTE — Transfer of Care (Signed)
 Immediate Anesthesia Transfer of Care Note  Patient: Andrew Monroe  Procedure(s) Performed: RIGHT TOTAL HIP ARTHROPLASTY ANTERIOR APPROACH (Right: Hip)  Patient Location: PACU  Anesthesia Type:Spinal  Level of Consciousness: awake, alert , oriented, and patient cooperative  Airway & Oxygen Therapy: Patient Spontanous Breathing and Patient connected to face mask oxygen  Post-op Assessment: Report given to RN and Post -op Vital signs reviewed and stable  Post vital signs: Reviewed and stable  Last Vitals:  Vitals Value Taken Time  BP 111/62 06/15/23 0852  Temp    Pulse 80 06/15/23 0855  Resp 16 06/15/23 0855  SpO2 100 % 06/15/23 0855  Vitals shown include unfiled device data.  Last Pain:  Vitals:   06/15/23 0620  TempSrc: Oral  PainSc:          Complications: No notable events documented.

## 2023-06-15 NOTE — Op Note (Signed)
 Operative Note  Date of operation: 06/15/2023 Preoperative diagnosis: Right hip osteoarthritis and a history of Perthes Disease Postoperative diagnosis: Same  Procedure: Right direct anterior total hip arthroplasty  Implants: Implant Name Type Inv. Item Serial No. Manufacturer Lot No. LRB No. Used Action  PINN SECTOR W/GRIP ACE CUP 56 - [id number] Hips PINN SECTOR W/GRIP ACE CUP 56  [employer] [id number] Right 1 Implanted  PINNACLE ALTRX PLUS 4 N 36X56 - [id number] Hips PINNACLE ALTRX PLUS 4 N 36X56  [employer] [id number] Right 1 Implanted  SCREW 6.5MMX25MM - [id number] Screw SCREW 6.5MMX25MM  [employer] [id number] Right 1 Implanted  FEM STEM 12/14 TAPER SZ 4 HIP - [id number] Orthopedic Implant FEM STEM 12/14 TAPER SZ 4 HIP  [employer] [id number] Right 1 Implanted  HEAD CERAMIC DELTA 36 PLUS 1.5 - [id number] Hips HEAD CERAMIC DELTA 36 PLUS 1.5  [employer] [id number] Right 1 Implanted   Surgeon: Vanita Panda. Magnus Ivan, MD Assistant: Rexene Edison, PA-C  Anesthesia: Spinal EBL: 350 cc Antibiotics: IV Ancef Complications: None  Indications: The patient is a 48 year old active gentleman who has a history of Perthes disease as a adolescent and child involving his right hip.  He has developed early onset osteoarthritis as a result which is common.  At this point his right hip pain is daily and it is detrimentally affecting his mobility, his quality of life and his actives daily living.  He does have a leg length discrepancy with his right side shorter left.  His x-rays show a shoulder femoral neck and a flattened femoral head with severe arthritis.  His left hip appears normal.  We agree with proceeding with hip replacement surgery.  He understands there all risks of acute loss anemia, nerve or vessel injury, DVT, dislocation, hardware failure, leg length issues and wound healing issues.  He understands that our goals are hopefully decreased pain, improved mobility and  improved quality of life.  Procedure description: After informed consent was obtained and the appropriate right hip was marked, the patient was brought to the operating room and set up on the stretcher where spinal anesthesia was obtained.  He was laid in supine position on stretcher.  I assessed his leg lengths and he is definitely shorter on the right than the left.  Traction boots were placed on both his feet and he was placed supine on the Hana fracture table with a perineal post and placed in both legs in inline skeletal traction devices but no traction applied.  His right operative hip and pelvis were assessed radiographically.  The right hip was prepped and draped with DuraPrep and sterile drapes.  A timeout was called and he was identified as the correct patient the correct right hip.  An incision was then made just inferior and posterior to the ASIS and carried slightly obliquely down the leg.  Dissection was carried down to the tensor fascia lata muscle and the tensor fascia was then divided longitudinally to proceed with a direct anterior process the hip.  Circumflex vessels were identified and cauterized.  The hip capsule was identified and opened up in L-type format finding a moderate joint effusion.  Cobra tractors were placed around the medial and lateral femoral neck and a femoral neck cut was made just proximal to the lesser trochanter with an oscillating saw.  This cut was completed with an osteotome.  A corkscrew guide was placed in the femoral head the femoral head was removed in its entirety and it was completely devoid of cartilage  and flattened and deformed.  A bent Hohmann was then placed over the medial acetabular rim and remnants of the acetabular labrum and other debris were removed.  Reaming was then initiated under direct visualization from a size 43 reamer and stepwise increments going up to a size 55 reamer with all reamers placed under direct visualization and the last reamer also  placed under direct fluoroscopy in order to obtain the depth of reaming, the inclination and the anteversion.  The real DePuy Sectra GRIPTION acetabular component size 56 was then placed without difficulty followed by a 36+4 neutral polythene liner.  Attention was then turned to the femur.  With the right leg externally rotated to 120 degrees, extended and adducted, a Mueller retractor was placed medially and a Hohmann retractor behind the greater trochanter.  The lateral joint capsule was released and a box cutting osteotome was used to enter the femoral canal.  Broaching was then initiated using the Actis broaching system from a size 0 going to a size 4.  We then trialed a standard offset femoral neck with a 36-2 head ball and the right leg was brought over and up and with traction and internal rotation reduced in the pelvis.  Assessing it radiographically and clinically we need to more offset and leg length.  We dislocated the hip and removed the trial components.  We then placed the real Actis femoral component with high offset size 4 and 1 with a 36+1.5 ceramic head ball.  Again this was reduced in the pelvis and is very tight and we felt like we equalize his leg lengths and offset and we are pleased with stability.  We assessed that mechanically and radiographically.  The soft tissue was then irrigated with normal saline solution using pulsatile lavage.  The joint capsule was closed with interrupted #1 Ethibond suture.  We did repair some of the tensor fascia muscle with #1 Vicryl.  The tensor fascia was then closed with #1 Vicryl followed by 0 Vicryl close deep tissue and 2-0 Vicryl close subcutaneous tissue.  The deep tissue and subcutaneous tissue were infiltrated with core percent Marcaine with epinephrine.  The skin was then closed with staples.  An Aquacel dressing was applied.  The patient was taken off the Hana table and taken to recovery in stable condition.  Rexene Edison, PA-C did assist during the  entire case from beginning to end and his assistance was crucial and medically necessary for soft tissue management and retraction, helping guide implant placement and a layered closure of the wound.

## 2023-06-15 NOTE — Evaluation (Signed)
 Physical Therapy Evaluation Patient Details Name: Andrew Monroe MRN: [id number] DOB: 09/07/1975 Today's Date: 06/15/2023  History of Present Illness  48 yo male presents to therapy s/p R THA, anterior approach on 06/15/2023 due to failure of conservative measures. Pt PMH includes but is not limited to: ankle fx, Perthes dz, myalgia, Hld, GERD, and R hip surgery.  Clinical Impression    Andrew Monroe is a 48 y.o. male POD 0 s/p R THA. Patient reports IND with mobility at baseline. Patient is now limited by functional impairments (see PT problem list below) and requires CGA and cues for transfers and gait with RW. Patient was able to ambulate 60 feet with RW and CGA and progressing to close S and cues for safe walker management. Patient educated on safe sequencing for stair mobility with L handrail, pain management and goal, use of RW, fall risk prevention, slowly increasing activity and car transfers pt and spouse verbalized understanding of safe guarding position for people assisting with mobility. Patient instructed in exercises to facilitate ROM and circulation reviewed and HO provided. Patient will benefit from continued skilled PT interventions to address impairments and progress towards PLOF. Patient has met mobility goals at adequate level for discharge home with family support and [facility] services; will continue to follow if pt continues acute stay to progress towards Mod I goals.       If plan is discharge home, recommend the following: A little help with walking and/or transfers;A little help with bathing/dressing/bathroom;Assistance with cooking/housework;Assist for transportation;Help with stairs or ramp for entrance   Can travel by private vehicle        Equipment Recommendations Rolling walker (2 wheels)  Recommendations for Other Services       Functional Status Assessment Patient has had a recent decline in their functional status and demonstrates the ability to make significant  improvements in function in a reasonable and predictable amount of time.     Precautions / Restrictions Precautions Precautions: Fall Restrictions Weight Bearing Restrictions Per Provider Order: No      Mobility  Bed Mobility Overal bed mobility: Needs Assistance Bed Mobility: Supine to Sit     Supine to sit: Supervision     General bed mobility comments: min cues    Transfers Overall transfer level: Needs assistance Equipment used: Rolling walker (2 wheels) Transfers: Sit to/from Stand Sit to Stand: Contact guard assist           General transfer comment: min cues    Ambulation/Gait Ambulation/Gait assistance: Supervision, Contact guard assist Gait Distance (Feet): 60 Feet Assistive device: Rolling walker (2 wheels) Gait Pattern/deviations: Step-to pattern, Antalgic Gait velocity: decreased     General Gait Details: minimal use of B UE support at RW, pt indicated feeling like his R leg was longer than the L, no increase in pain with mobility tasks, min cues for safety and proper use of Rw  Stairs Stairs: Yes Stairs assistance: Contact guard assist Stair Management: Two rails Number of Stairs: 3 General stair comments: step navigation instruction initiated with B handrail, CGA and cues for technique adn sequencing, pt able to progress to use of L handrail only with CGA and min cues  Wheelchair Mobility     Tilt Bed    Modified Rankin (Stroke Patients Only)       Balance Overall balance assessment: Needs assistance Sitting-balance support: Feet supported Sitting balance-Leahy Scale: Good     Standing balance support: Bilateral upper extremity supported, During functional activity, Reliant on assistive  device for balance Standing balance-Leahy Scale: Fair Standing balance comment: static standing no UE support                             Pertinent Vitals/Pain Pain Assessment Pain Assessment: 0-10 Pain Score: 2  Pain Location: R  hip and LE Pain Descriptors / Indicators: Aching, Discomfort, Dull, Operative site guarding Pain Intervention(s): Limited activity within patient's tolerance, Monitored during session, Premedicated before session, Repositioned, Ice applied    Home Living Family/patient expects to be discharged to:: Private residence Living Arrangements: Spouse/significant other;Children Available Help at Discharge: Family Type of Home: House Home Access: Stairs to enter Entrance Stairs-Rails: Left Entrance Stairs-Number of Steps: 6   Home Layout: Two level;Able to live on main level with bedroom/bathroom Home Equipment: None      Prior Function Prior Level of Function : Independent/Modified Independent;Driving;Working/employed             Mobility Comments: IND no AD for all ADL, self care tasks and IADLs       Extremity/Trunk Assessment        Lower Extremity Assessment Lower Extremity Assessment: RLE deficits/detail RLE Deficits / Details: ankle DF/PF 5/5 RLE Sensation: decreased light touch    Cervical / Trunk Assessment Cervical / Trunk Assessment: Normal  Communication   Communication Communication: No apparent difficulties    Cognition Arousal: Alert Behavior During Therapy: WFL for tasks assessed/performed   PT - Cognitive impairments: No apparent impairments                         Following commands: Intact       Cueing       General Comments      Exercises Total Joint Exercises Ankle Circles/Pumps: AROM, Both, 10 reps Quad Sets: AROM, Right, 5 reps Heel Slides: AROM, Right, 5 reps Hip ABduction/ADduction: AROM, Right, 5 reps, Standing Long Arc Quad: AROM, Seated, 5 reps, Right Knee Flexion: AROM, Right, 5 reps, Standing Standing Hip Extension: AROM, Right, 5 reps, Standing   Assessment/Plan    PT Assessment Patient needs continued PT services  PT Problem List Decreased strength;Decreased range of motion;Decreased activity  tolerance;Decreased balance;Decreased mobility;Decreased coordination;Pain       PT Treatment Interventions DME instruction;Gait training;Stair training;Functional mobility training;Therapeutic activities;Therapeutic exercise;Balance training;Neuromuscular re-education;Patient/family education;Modalities    PT Goals (Current goals can be found in the Care Plan section)  Acute Rehab PT Goals Patient Stated Goal: to return to active lifestyle playing sports and have no pain in R hip PT Goal Formulation: With patient Time For Goal Achievement: 06/29/23 Potential to Achieve Goals: Good    Frequency 7X/week     Co-evaluation               AM-PAC PT "6 Clicks" Mobility  Outcome Measure Help needed turning from your back to your side while in a flat bed without using bedrails?: None Help needed moving from lying on your back to sitting on the side of a flat bed without using bedrails?: A Little Help needed moving to and from a bed to a chair (including a wheelchair)?: A Little Help needed standing up from a chair using your arms (e.g., wheelchair or bedside chair)?: A Little Help needed to walk in hospital room?: A Little Help needed climbing 3-5 steps with a railing? : A Little 6 Click Score: 19    End of Session Equipment Utilized During Treatment: Gait belt  PT Visit Diagnosis: Unsteadiness on feet (R26.81);Other abnormalities of gait and mobility (R26.89);Muscle weakness (generalized) (M62.81);Difficulty in walking, not elsewhere classified (R26.2);Pain Pain - Right/Left: Right Pain - part of body: Hip;Leg    Time: 1420-1507 PT Time Calculation (min) (ACUTE ONLY): 47 min   Charges:   PT Evaluation $PT Eval Low Complexity: 1 Low PT Treatments $Gait Training: 8-22 mins $Therapeutic Exercise: 8-22 mins PT General Charges $$ ACUTE PT VISIT: 1 Visit         Johnny Bridge, PT Acute Rehab   Jacqualyn Posey 06/15/2023, 3:20 PM

## 2023-06-15 NOTE — Discharge Instructions (Signed)

## 2023-06-15 NOTE — Anesthesia Postprocedure Evaluation (Signed)
 Anesthesia Post Note  Patient: Andrew Monroe  Procedure(s) Performed: RIGHT TOTAL HIP ARTHROPLASTY ANTERIOR APPROACH (Right: Hip)     Patient location during evaluation: PACU Anesthesia Type: MAC and Spinal Level of consciousness: awake and alert and oriented Pain management: pain level controlled Vital Signs Assessment: post-procedure vital signs reviewed and stable Respiratory status: spontaneous breathing, nonlabored ventilation and respiratory function stable Cardiovascular status: blood pressure returned to baseline and stable Postop Assessment: no headache, no backache, spinal receding and patient able to bend at knees Anesthetic complications: no   No notable events documented.  Last Vitals:  Vitals:   06/15/23 1100 06/15/23 1115  BP: 131/75 127/76  Pulse: (!) 55 (!) 58  Resp: 11 13  Temp: 36.5 C   SpO2: 100% 100%    Last Pain:  Vitals:   06/15/23 1115  TempSrc:   PainSc: 0-No pain                 Lannie Fields

## 2023-06-15 NOTE — Anesthesia Procedure Notes (Signed)
 Spinal  Patient location during procedure: OR Start time: 06/15/2023 7:20 AM End time: 06/15/2023 7:26 AM Reason for block: surgical anesthesia Staffing Performed: anesthesiologist  Anesthesiologist: Lannie Fields, DO Performed by: Lannie Fields, DO Authorized by: Lannie Fields, DO   Preanesthetic Checklist Completed: patient identified, IV checked, risks and benefits discussed, surgical consent, monitors and equipment checked, pre-op evaluation and timeout performed Spinal Block Patient position: sitting Prep: DuraPrep and site prepped and draped Patient monitoring: cardiac monitor, continuous pulse ox and blood pressure Approach: midline Location: L2-3 Injection technique: single-shot Needle Needle type: Pencan  Needle gauge: 24 G Needle length: 9 cm Assessment Sensory level: T6 Events: CSF return and second provider Additional Notes Functioning IV was confirmed and monitors were applied. Sterile prep and drape, including hand hygiene and sterile gloves were used. The patient was positioned and the spine was prepped. The skin was anesthetized with lidocaine.  Free flow of clear CSF was obtained prior to injecting local anesthetic into the CSF.  The spinal needle aspirated freely following injection.  The needle was carefully withdrawn.  The patient tolerated the procedure well.

## 2023-06-15 NOTE — Interval H&P Note (Signed)
 History and Physical Interval Note: The patient understands that he has here today for a right total hip replacement to treat his significant right hip pain and arthritis.  There has been no acute or interval change in his medical status.  The risks and benefits of surgery have been discussed in detail and informed consent has been obtained.  The right operative hip has been marked.  06/15/2023 6:51 AM  Barbette Merino Pulsifer  has presented today for surgery, with the diagnosis of osteoarthritis right hip.  The various methods of treatment have been discussed with the patient and family. After consideration of risks, benefits and other options for treatment, the patient has consented to  Procedure(s): RIGHT TOTAL HIP ARTHROPLASTY ANTERIOR APPROACH (Right) as a surgical intervention.  The patient's history has been reviewed, patient examined, no change in status, stable for surgery.  I have reviewed the patient's chart and labs.  Questions were answered to the patient's satisfaction.     Kathryne Hitch

## 2023-06-18 ENCOUNTER — Encounter (HOSPITAL_COMMUNITY): Payer: Self-pay | Admitting: Orthopaedic Surgery

## 2023-06-19 ENCOUNTER — Telehealth: Payer: Self-pay | Admitting: Orthopaedic Surgery

## 2023-06-19 ENCOUNTER — Encounter: Payer: Self-pay | Admitting: Orthopaedic Surgery

## 2023-06-19 NOTE — Telephone Encounter (Signed)
 Patient called. He has some questions. Would like a call back. [phone]

## 2023-06-28 ENCOUNTER — Encounter: Payer: Self-pay | Admitting: Physician Assistant

## 2023-06-28 ENCOUNTER — Other Ambulatory Visit: Payer: Self-pay

## 2023-06-28 ENCOUNTER — Ambulatory Visit (INDEPENDENT_AMBULATORY_CARE_PROVIDER_SITE_OTHER): Payer: 59 | Admitting: Physician Assistant

## 2023-06-28 DIAGNOSIS — Z96641 Presence of right artificial hip joint: Secondary | ICD-10-CM

## 2023-06-28 NOTE — Progress Notes (Signed)
 HPI: Mr. Bauserman returns today status post right total hip arthroplasty 06/15/2023.  He states he is doing well.  He is using no assistive device.  He is not taking an aspirin 81 mg twice daily.  No aspirin prior to surgery.  He is also taking ibuprofen 600 mg twice daily.  He stopped his oxycodone.  Taking Robaxin as needed.  Denies any burning-like sensation states his pain is 3 out of 10 at worst.  He feels his range of motion and strength are improving.  He has no concerns otherwise.  Review of systems see HPI otherwise negative  Physical exam: General Well-developed well-nourished male no acute distress.  Ambulates without any assistive device nonantalgic gait. Right hip good range of motion without pain calf supple nontender.  Dorsiflexion plantarflexion ankle intact.  Right hip incision is healing well no signs of infection.  Staples harvested.  No wound dehiscence.  Impression: Status post right total hip arthroplasty  Plan: He will continue to work on range of motion strengthening.  Continue the aspirin 81 mg mouth once daily for a week and then discontinue.  Follow-up with [place] in 4 weeks sooner if there is any questions concerns.  Scar tissue mobilization encouraged.  He is able to get the incision wet in shower but not submerge the incision.

## 2023-07-26 ENCOUNTER — Ambulatory Visit: Admitting: Physician Assistant

## 2023-07-26 ENCOUNTER — Encounter: Payer: Self-pay | Admitting: Physician Assistant

## 2023-07-26 DIAGNOSIS — Z96641 Presence of right artificial hip joint: Secondary | ICD-10-CM

## 2023-07-26 NOTE — Progress Notes (Signed)
 HPI: [name] returns today 6 weeks status post right total hip arthroplasty.  He is overall doing well states he is just somewhat sore.  He states the hip does not feel fully stable when going up hills.  He is having some increased back pain with daily activities but no radicular symptoms.  He is taking Tylenol ibuprofen or naproxen for the pain.  Review of systems: Negative for fevers chills.  Physical exam: Right hip: Good range of motion without pain.  Right calf supple nontender dorsiflexion plantarflexion right ankle intact.  Ambulates without any assistive device and a nonantalgic gait.  Impression: Status post right total hip arthroplasty  Plan: At this point in time we will have him follow-up with [place] at 89-months postop we will obtain an AP pelvis and lateral view of the right hip.  He will follow-up with [place] sooner if there is any questions concerns.  Questions were encouraged and answered at length.

## 2023-11-29 ENCOUNTER — Ambulatory Visit (INDEPENDENT_AMBULATORY_CARE_PROVIDER_SITE_OTHER): Admitting: Physician Assistant

## 2023-11-29 ENCOUNTER — Encounter: Payer: Self-pay | Admitting: Physician Assistant

## 2023-11-29 ENCOUNTER — Other Ambulatory Visit (INDEPENDENT_AMBULATORY_CARE_PROVIDER_SITE_OTHER): Payer: Self-pay

## 2023-11-29 DIAGNOSIS — Z96641 Presence of right artificial hip joint: Secondary | ICD-10-CM

## 2023-11-29 NOTE — Progress Notes (Signed)
 HPI: Andrew Monroe comes in today for follow-up status post right total hip arthroplasty 06/15/2023.  He states he is overall doing well.  He states he does have some muscular tightness and what he describes as soreness about the incision.  Arthritis pain however is gone.  He is back to full activities.  Takes ibuprofen  as needed for discomfort.  He has been working on scar tissue massage.  Review of systems: See HPI otherwise negative  Physical exam: General Well-developed well-nourished male in no acute distress ambulates without any assistive device Right hip: Excellent range of motion without pain.  Radiographs: AP pelvis lateral view right hip: Status post right total hip arthroplasty well-seated components.  No acute fractures acute findings.  Impression: Status post right total hip arthroplasty  Plan: Recommend that he continue to work on scar tissue mobilization even seen massage therapist for deep tissue massage.  Follow-up with us  at 1 year postop sooner if there is any questions concerns.

## 2024-02-18 ENCOUNTER — Encounter: Payer: Self-pay | Admitting: Radiology
